# Patient Record
Sex: Female | Born: 1935 | Race: White | Hispanic: No | Smoking: Never smoker
Health system: Southern US, Community
[De-identification: ages and names within clinical notes are randomized; demographics above are authoritative.]

## PROBLEM LIST (undated history)

## (undated) DIAGNOSIS — I10 Essential (primary) hypertension: Secondary | ICD-10-CM

## (undated) DIAGNOSIS — K219 Gastro-esophageal reflux disease without esophagitis: Secondary | ICD-10-CM

## (undated) DIAGNOSIS — E039 Hypothyroidism, unspecified: Secondary | ICD-10-CM

## (undated) DIAGNOSIS — J449 Chronic obstructive pulmonary disease, unspecified: Secondary | ICD-10-CM

## (undated) HISTORY — PX: OTHER SURGICAL HISTORY: SHX169

## (undated) HISTORY — PX: CHOLECYSTECTOMY: SHX55

---

## 2002-04-07 ENCOUNTER — Encounter: Payer: Self-pay | Admitting: Specialist

## 2002-04-07 ENCOUNTER — Encounter: Payer: Self-pay | Admitting: Emergency Medicine

## 2002-04-07 ENCOUNTER — Inpatient Hospital Stay (HOSPITAL_COMMUNITY): Admission: EM | Admit: 2002-04-07 | Discharge: 2002-04-08 | Payer: Self-pay | Admitting: Unknown Physician Specialty

## 2002-04-07 ENCOUNTER — Encounter: Payer: Self-pay | Admitting: Orthopedic Surgery

## 2002-07-08 ENCOUNTER — Ambulatory Visit (HOSPITAL_COMMUNITY): Admission: RE | Admit: 2002-07-08 | Discharge: 2002-07-08 | Payer: Self-pay | Admitting: Specialist

## 2002-12-03 ENCOUNTER — Encounter: Payer: Self-pay | Admitting: Orthopedic Surgery

## 2002-12-05 ENCOUNTER — Inpatient Hospital Stay (HOSPITAL_COMMUNITY): Admission: RE | Admit: 2002-12-05 | Discharge: 2002-12-07 | Payer: Self-pay | Admitting: Orthopedic Surgery

## 2002-12-06 ENCOUNTER — Encounter (INDEPENDENT_AMBULATORY_CARE_PROVIDER_SITE_OTHER): Payer: Self-pay | Admitting: *Deleted

## 2003-06-28 ENCOUNTER — Emergency Department (HOSPITAL_COMMUNITY): Admission: EM | Admit: 2003-06-28 | Discharge: 2003-06-28 | Payer: Self-pay | Admitting: Emergency Medicine

## 2003-07-06 ENCOUNTER — Emergency Department (HOSPITAL_COMMUNITY): Admission: EM | Admit: 2003-07-06 | Discharge: 2003-07-06 | Payer: Self-pay

## 2003-07-07 ENCOUNTER — Inpatient Hospital Stay (HOSPITAL_COMMUNITY): Admission: EM | Admit: 2003-07-07 | Discharge: 2003-07-11 | Payer: Self-pay | Admitting: Emergency Medicine

## 2003-07-17 ENCOUNTER — Encounter: Admission: RE | Admit: 2003-07-17 | Discharge: 2003-07-17 | Payer: Self-pay | Admitting: Internal Medicine

## 2003-07-23 ENCOUNTER — Encounter: Admission: RE | Admit: 2003-07-23 | Discharge: 2003-07-23 | Payer: Self-pay | Admitting: Internal Medicine

## 2004-09-06 ENCOUNTER — Emergency Department (HOSPITAL_COMMUNITY): Admission: EM | Admit: 2004-09-06 | Discharge: 2004-09-06 | Payer: Self-pay | Admitting: *Deleted

## 2005-05-03 ENCOUNTER — Emergency Department (HOSPITAL_COMMUNITY): Admission: EM | Admit: 2005-05-03 | Discharge: 2005-05-03 | Payer: Self-pay | Admitting: Emergency Medicine

## 2007-11-07 ENCOUNTER — Emergency Department (HOSPITAL_COMMUNITY): Admission: EM | Admit: 2007-11-07 | Discharge: 2007-11-07 | Payer: Self-pay | Admitting: Emergency Medicine

## 2007-12-27 ENCOUNTER — Encounter: Admission: RE | Admit: 2007-12-27 | Discharge: 2007-12-27 | Payer: Self-pay | Admitting: *Deleted

## 2009-01-21 ENCOUNTER — Emergency Department (HOSPITAL_COMMUNITY): Admission: EM | Admit: 2009-01-21 | Discharge: 2009-01-21 | Payer: Self-pay | Admitting: Emergency Medicine

## 2010-10-24 LAB — CBC
HCT: 43.7 % (ref 36.0–46.0)
Hemoglobin: 14.9 g/dL (ref 12.0–15.0)
MCHC: 34.2 g/dL (ref 30.0–36.0)
MCV: 89.7 fL (ref 78.0–100.0)
Platelets: 236 10*3/uL (ref 150–400)
RBC: 4.88 MIL/uL (ref 3.87–5.11)
RDW: 13.8 % (ref 11.5–15.5)
WBC: 6.7 10*3/uL (ref 4.0–10.5)

## 2010-10-24 LAB — DIFFERENTIAL
Basophils Absolute: 0.1 10*3/uL (ref 0.0–0.1)
Basophils Relative: 1 % (ref 0–1)
Eosinophils Absolute: 0.2 10*3/uL (ref 0.0–0.7)
Eosinophils Relative: 2 % (ref 0–5)
Lymphocytes Relative: 25 % (ref 12–46)
Lymphs Abs: 1.7 10*3/uL (ref 0.7–4.0)
Monocytes Absolute: 0.7 10*3/uL (ref 0.1–1.0)
Monocytes Relative: 11 % (ref 3–12)
Neutro Abs: 4.1 10*3/uL (ref 1.7–7.7)
Neutrophils Relative %: 62 % (ref 43–77)

## 2010-10-24 LAB — BASIC METABOLIC PANEL
BUN: 9 mg/dL (ref 6–23)
CO2: 23 mEq/L (ref 19–32)
Calcium: 9.6 mg/dL (ref 8.4–10.5)
Chloride: 108 mEq/L (ref 96–112)
Creatinine, Ser: 0.83 mg/dL (ref 0.4–1.2)
GFR calc Af Amer: >60 mL/min (ref >60)
GFR calc non Af Amer: >60 mL/min (ref >60)
Glucose, Bld: 105 mg/dL — ABNORMAL HIGH (ref 70–99)
Potassium: 4.5 mEq/L (ref 3.5–5.1)
Sodium: 140 mEq/L (ref 135–145)

## 2010-10-24 LAB — POCT CARDIAC MARKERS
CKMB, poc: 1.1 ng/mL (ref 1.0–8.0)
Myoglobin, poc: 101 ng/mL (ref 12–200)
Troponin i, poc: <0.05 ng/mL (ref 0.00–0.09)

## 2010-10-24 LAB — BRAIN NATRIURETIC PEPTIDE: Pro B Natriuretic peptide (BNP): <30 pg/mL (ref 0.0–100.0)

## 2010-12-03 NOTE — Discharge Summary (Signed)
NAME:  Dana Pham, Dana Pham NO.:  [id number]   MEDICAL RECORD NO.:  [id number]                   PATIENT TYPE:  INP   LOCATION:  5727                                 FACILITY:  [facility]   PHYSICIAN:  Manning Charity, MD                  DATE OF BIRTH:  [birth year]   DATE OF ADMISSION:  07/07/2003  DATE OF DISCHARGE:  07/11/2003                                 DISCHARGE SUMMARY   DISCHARGE DIAGNOSES:  1. Pneumonia.  2. Suspected chronic obstructive pulmonary disease.  3. Euthyroid syndrome  4. Tobacco use.   DISCHARGE MEDICATIONS:  1. Serevent 1 puff q.12h.  2. Albuterol inhaler 2 puffs q.4h. p.r.n. shortness of breath.  3. Atrovent inhaler 3 puffs q.6h.  4. Prednisone taper consisting of 60 mg a day for 3 day, 50 mg a day for 3     days, 40 mg a day for 3 days, 30 mg a day for 3 day, 20 mg a day for 3     days, 10 mg a day for 3 days, and then stop.  5. Tequin 400 mg a day for 7 days after discharge.   PROCEDURES:  CT of the abdomen and chest at time of admission to rule out  aortic dissection and coarctation for subclavian steal syndrome.  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   CONSULTATIONS:  None.   HISTORY OF PRESENT ILLNESS:  The patient is a 77 year old white female with  no significant past medical history, on no medications currently and not  being seen by doctor, who presented to emergency department with nine days  of shortness of breath and cough productive of clear sputum.  The patient  had been to the emergency room about two weeks prior to admission with the  same symptoms and was discharged on prednisone, albuterol inhaler, and  antibiotics.  She then returned to the emergency room the Saturday prior to  admission with similar symptoms and was again discharged on the same  medications.  The  patient reported back to the emergency room on the day of  admission complaining of continuing symptoms.  The patient also reported  subjective fevers and chills and said she was feeling worse than she was two  weeks ago.  The patient reports that she never got her antibiotics  prescription filled and had not been seen by private doctor as instructed.  The patient denies any nausea, vomiting, chest pain.   PHYSICAL EXAMINATION:  VITAL SIGNS:  T-max 101.9, currently 98.7, pulse 79,  blood pressure in left arm 71/50, right arm 151/73, respiratory rate 28, O2  saturation 20% on room air.  GENERAL:  The patient was sitting up in bed with increased work of  breathing.  HEENT:  Eyes equal and reactive to light, nonicteric.  Throat without  erythema.  Oral mucosa moist.  NECK:  Supple without palpable thyroid masses.  LUNGS:  Respirations showed coarse breath sounds throughout, occasional  wheezing.  CARDIOVASCULAR:  Regular rate and rhythm with no murmurs, rubs, or gallops.  ABDOMEN:  Soft, nontender, obese.  Positive bowel sounds in all four  quadrants.  EXTREMITIES:  Without edema, 2+ pulses in extremities, 2+ pulses in right  upper extremity, and no palpable pulses in the left upper extremity.  RECTAL:  Guaiac negative.  SKIN:  Warm and dry.  LYMPH:  Without lymphadenopathy.  NEUROLOGIC:  Alert and oriented x 4.  Cranial nerves II-XII grossly intact  with symmetric strength and 2+ deep tendon reflexes.   LABORATORY DATA:  BMET: Sodium 136, potassium 4.4, chloride 102, CO2 25, BUN  11, creatinine 0.9, glucose 104.  CBC:  White blood cell count 15.9,  absolute neutrophil count 13.8, hemoglobin 13.9, platelet count 321,000.   Chest x-ray:  Bilateral patchy infiltrates which were new since x-rayed on  December 11.   Point-of-care cardiac enzymes in the emergency room were negative for any  signs of recent ischemia.   HOSPITAL COURSE:  #1.  PNEUMONIA:  Respiratory cultures, blood  cultures, and urine Legionella  antigen were all ordered.  These were all negative.  The patient was started  on broad-spectrum antibiotics, originally IV Rocephin and Zithromax.  This  was changed to p.o. Avelox as the patient improved.  The patient was also  started on IV Solu-Medrol initially for increased wheezing and suspected  COPD.  This was changed to p.o. prednisone as the patient improved.  The  patient was treated symptomatically and improved during hospitalization with  markedly decreased dyspnea.  Respiratory exam improved during  hospitalization as well with crackles in lungs bilaterally at the time of  discharge but no wheezes, greatly improved air movement.  The patient is to  return to the clinic on December 30 for recheck of her oxygen saturations  and repeat respiratory exam.   #2.  BLOOD PRESSURE GRADIENT BETWEEN LEFT ARM AND RIGHT ARM:  Chest CT was  obtained on admission with results as noted above.  Arterial Dopplers were  obtained of the bilateral upper extremities.  These revealed a significant  stenosis of the subclavian artery.  The patient has had this problem for a  long time with good collateral circulation, left arm warm, with good hair  distribution.  No further workup was pursued during hospitalization.   #3.  EUTHYROID SYNDROME:  TSH was noted to be markedly decreased at 0.109 on  first draw and 0.049 on second draw.  However, free T4 was  within normal  range at 1.29.  Therefore, this was attributed to euthyroid syndrome.  The  patient is asymptomatic for any symptoms of thyroid disease; however, she  does hae a history of hypothyroidism in her family.  Needs to be rechecked  as an outpatient.   #4.  TOBACCO USE:  The patient was counseled extensively.  Smoking cessation  consult was obtained.  The patient decided to quit smoking.  Nicotine patch  was used while in the hospital.  The patient states that she plans to continue this as an outpatient.  She  was also counseled on the harm of  second-hand smoke.   DISPOSITION:  The patient was discharged to home with her son in good  condition.  She has followup appointment in the internal medicine outpatient  clinic  on December 30 at 9 o'clock.  At that time, she needs a recheck of  her oxygen saturation and repeat respiratory exam.   Issues at followup: The patient likely needs PFTs when acute illness has  resolved to get an idea of her baseline function.  Strongly suspect COPD.  The patient will also need a repeat TSH at some point after discharge.  The  patient will also need monitoring of her left upper extremity to ensure that  blood flow is not further compromised and she has no signs of ischemia.                                                Manning Charity, MD    KK/MEDQ  D:  07/11/2003  T:  07/13/2003  Job:  [id number]   cc:   Outpatient Clinic

## 2010-12-03 NOTE — Op Note (Signed)
NAME:  Dana Pham, Dana Pham                         ACCOUNT NO.:  [id number]   MEDICAL RECORD NO.:  [id number]                   PATIENT TYPE:  INP   LOCATION:  5017                                 FACILITY:  [facility]   PHYSICIAN:  Dionne Ano. Everlene Other, M.D.         DATE OF BIRTH:  03/09/1932   DATE OF PROCEDURE:  12/05/2002  DATE OF DISCHARGE:                                 OPERATIVE REPORT   PREOPERATIVE DIAGNOSES:  1. Painful malunion, right olecranon.  2. Traumatic arthritis, radial capitellar joint, right elbow with     incongruity about the joint.   POSTOPERATIVE DIAGNOSES:  1. Painful malunion, right olecranon.  2. Traumatic arthritis, radial capitellar joint, right elbow with     incongruity about the joint.   OPERATION PERFORMED:  1. Takedown nonunion, right olecranon with application of plate and screws     and iliac crest bone graft obtained from the right iliac crest.  Symphony     platelet aggregate was also added to the bone graft mixture for healing     purposes.  2. Manipulation under anesthesia, right elbow.  3. Radial head resection about the radial capitellar joint.  4. Stress radiography, right elbow.   SURGEON:  Dionne Ano. Amanda Pea, M.D.   ASSISTANT:  Karie Chimera, P.A.-C.   ANESTHESIA:  General.   COMPLICATIONS:  None.   TOURNIQUET TIME:  90 minutes.   DRAINS:  One in the right hip.   INDICATIONS FOR PROCEDURE:  The patient is a very pleasant 77 year old  female who presents with the above mentioned diagnosis.  I have counseled  her in regard to the risks and benefits of surgery including the risks of  infection, bleeding, anesthesia, damage to normal structures and failure of  surgery to accomplish its intended goals of relieving symptoms and restoring  function.  With this in mind, the patient desires to proceed.  All questions  have been encouraged and answered preoperatively.  The patient understands  the operative plan, risks of malunion,  nonunion, and other complications  such as infection and hardware failure.  I have implored her to quit  smoking, to perform physical exercise and to adequately maintain proper  nutritional status.  She understands this and has been trying her best.  She  is ready for surgery and understands all risks and benefits.   DESCRIPTION OF PROCEDURE:  The patient was seen by myself and anesthesia,  taken to the operative suite and underwent the smooth induction of general  anesthetic. She was then laid supine and then turned slightly and  appropriately positioned with a bean bag.  The bean bag was inflated.  I  then isolated the right upper extremity and performed range of motion.  She  had a blocked pronation and supination and poor extension and flexion.  The  patient had the hip and right arm isolated, prepped and draped in the usual  sterile fashion after all body  parts were padded nicely.  Once sterile prep  was secured, the patient had 20mL of Marcaine 0.25% with epinephrine placed  in the hip prior to obtaining iliac crest bone graft.  The patient then  underwent tourniquet insufflation with sterile tourniquet to 250 mmHg and  incision was made along previously made outline marks prior to placement of  Ioban against the skin.  Dissection was carried down full thickness, through  the skin down to the fascia.  I then elevated the fascia off of the  olecranon and proximal ulna.  Once this was done, the nonunion site was  identified.  Following this, I preplaced the plate about the ulna taking  care to note the pathway of the ulnar nerve and stay out of harm's way.  I  placed one screw hole and then placed pin marks in the ulna for rotation  purposes and following this, then took down the nonunion site with a  combination knife, curet, drill and rongeur.  I was able to open both the  proximal and distal medullary canals down to cancellous bone and completely  resect the nonunion to my  satisfaction.  Following this, I then  irrigated  the  wound copiously.  Once this was done, we then turned attention towards  the hip.  An incision was made two inches in length and carried down to the  fascia sharply.  Retractor was placed, the fascia was incised deeply and  elevated off of the iliac crest.  I then placed a Baby Bennett retractor and  made a cortical window.  Following this, I opened a cortical window and  obtained a large amount of cancellous bone graft.  This was done to my  satisfaction.  Following this, I irrigated the wound and placed Gelfoam with  Thrombin in the wound and then packed it with moistened gauze.  The patient  was then turned back towards the olecranon.  The nonunion site was packed  tightly with iliac crest bone graft, Symphony platelet aggregate, spun down  with centrifuge was then added to this mixture.  I then applied the plate  and screws from the Acumed plate fixation system.  This was done in standard  AO technique.  It was done to my satisfaction without difficulty.  I was  pleased with the screws and placed this under compression.  The bone was  tightly packed in both canals proximally and distally and at the nonunion  site.  I was very pleased with this and the findings.  Following this,  I  then took x-rays AP and lateral and multiple obliques to ensure proper  position of the screws.  I made adjustments as necessary and was very  pleased with the fixation.  Once this was done, the patient had additional  platelet aggregate placed in the nonunion site.  I then closed this wound  with 0 Vicryl over the periosteal tissues.  This closed exceptionally well.  Following this, an interval was created between the fascia and subcu and the  anconeus ECU interval was identified and opened.  I then identified the  radial capitellar joint which was quite crepitant and arthritic.  I placed  Baby Bennett retractors around the radial neck and then resected  the radial head.  I then debrided the proximal radial ulnar joint and the elbow joint  itself.  This was done to my satisfaction without difficulty.  Following  removal of the radial head, it was then sent for specimen.  This  was a very  arthritic and incongruous head.  The capitellum was also arthritic.  Following this, the block to pronation and supination was alleviated.  I  stress tested the forearm and noted there was no subsidence of the radial  shaft indicative of an Essex -Lopresti injury.  Following this, I irrigated  the wound, smoothed the end and placed bone wax against the end of the  radial shaft, further debridement was accomplished and following this, the  interval was closed after I placed a small amount of platelet aggregate in  the tissues.  This was closed with Vicryl.  I took care to be aware of the  pathway of the posterior interosseous nerve and protect it at all times  during the radial head resection.  This was not dissected.  Following  closure of this wound, I then turned attention back towards the hip.  This  was irrigated copiously and then closed after hemostasis was secured with 0  Vicryl followed by 2-0 Vicryl and a subcuticular Prolene.  An eighth inch  Hemovac drain (medium Hemovac drain) was placed prior to the deep closure.  This closed nicely and had good hemostasis.  Platelet poor aggregate was  placed in this area as well for bleeding control purposes.  I then placed  the Thrombin platelet poor mixture about the elbow and closed this with 3-0  Vicryl followed by a staple down at the skin edge.  The patient did undergo  manipulation of the right elbow under anesthesia during the procedure and I  noted that the patient had full pronation and supination and extension to 30  degrees and nearly full flexion.  This was done by manipulating the elbow to  the point of maximal resistance and then holding this and giving it a bit  extra pressure.  This was done  to my satisfaction without difficulty and I  was pleased with the gains made.  Once this was all complete, I then dressed  the wounds sterilely and placed a posterior fascial splint about the elbow.  The patient tolerated the procedure well without difficulty.  Had excellent  refill, good pulse, no sign of compartment syndrome and was stable.  The  patient was then extubated and transferred to recovery  room and will be monitored.  Will plan for intravenous antibiotics,  elevation, ice, occupational therapy consult for range of motion and  movement control.  Pain management appropriate to her needs and other  postoperative measures.  I discussed do's and do not's with her  and all  questions have been encouraged and answered.                                                Dionne Ano. Everlene Other, M.D.    Nash Mantis  D:  12/05/2002  T:  12/06/2002  Job:  [id number]

## 2010-12-03 NOTE — H&P (Signed)
   NAME:  Dana Pham, Dana Pham                         ACCOUNT NO.:  [id number]   MEDICAL RECORD NO.:  [id number]                   PATIENT TYPE:  INP   LOCATION:  0462                                 FACILITY:  [facility]   PHYSICIAN:  Philips J. Montez Morita, M.D.             DATE OF BIRTH:  [birth year]   DATE OF ADMISSION:  04/07/2002  DATE OF DISCHARGE:                                HISTORY & PHYSICAL   CHIEF COMPLAINT:  I hurt my elbow.   HISTORY OF PRESENT ILLNESS:  This is a 77 year old lady who fell. X-rays  have showed a comminuted displaced fracture of the right olecranon.   PAST MEDICAL HISTORY:  I did a total knee on her about four or five years  ago, her general health otherwise has been excellent.   MEDICINES:  None.   ALLERGIES:  None.   REVIEW OF SYMPTOMS:  HEENT:  No recent problems. CHEST:  No cough,  hemoptysis or TB. No angina, chest pain, shortness of breath, hypertension.  ABDOMEN:  No diarrhea, constipation, bowel changes. GU:  No stones,  infection, pyuria. NEUROLOGIC:  No fits, paralyses or convulsions.   PHYSICAL EXAMINATION:  EXTREMITIES:  The right elbow swollen and painful to  move. Neurovascular looks okay in that extremity. Right total knee scar.  HEART/LUNGS:  Okay. ABDOMEN:  Soft.   IMPRESSION:  Displaced comminuted right olecranon fracture.   PLAN:  Admit for open reduction and internal fixation.                                               Philips J. Montez Morita, M.D.    PJC/MEDQ  D:  04/07/2002  T:  04/08/2002  Job:  [id number]

## 2010-12-03 NOTE — Op Note (Signed)
   NAME:  Dana Pham, Dana Pham                         ACCOUNT NO.:  [id number]   MEDICAL RECORD NO.:  [id number]                   PATIENT TYPE:  INP   LOCATION:  0462                                 FACILITY:  [facility]   PHYSICIAN:  Philips J. Montez Morita, M.D.             DATE OF BIRTH:  [birth year]   DATE OF PROCEDURE:  04/07/2002  DATE OF DISCHARGE:                                 OPERATIVE REPORT   PREOPERATIVE DIAGNOSES:  Comminuted displaced closed right olecranon  fracture.   POSTOPERATIVE DIAGNOSES:  Comminuted displaced closed right olecranon  fracture.   PROCEDURE:  Open reduction and internal fixation.   DESCRIPTION OF PROCEDURE:  After suitable general anesthesia, the arm was  prepped and draped routinely with the elbow across the chest. An upper arm  tourniquet has been inflated to 250 mmHg. A linear incision is made over the  olecranon extending posteriorly and the fracture site is identified and  reduced. A large Steinmann pin is driven across the fracture site and a  single x-ray is made to confirm its position which looks good. A drill hole  was then made through the distal ulna and an 18 gauge wire is passed. It is  placed in a figure-of-eight fashion around the Steinmann pin and a double  tensioning maneuver is applied so that both sides can be tensioned and the  wires are tensioned and tightened closing down the fracture site and giving  excellent stability. The Steinmann pin is then held with a pair of pliers  and bent to 90 degrees, it is cut off and then hammered into the soft tissue  of the triceps tendon. The tourniquet is let down at this point, it is about  30 minutes. Hemostasis is secured. The closure with 2-0 coated Vicryl and  then a running 3-0 Monocryl in the subcu with Steri-Strips, about 15 cc of  0.5% Marcaine about the area. A nice compression dressing, a posterior  splint, she goes to recovery in good condition.         Philips J. Montez Morita, M.D.    PJC/MEDQ  D:  04/07/2002  T:  04/08/2002  Job:  [id number]

## 2010-12-03 NOTE — Op Note (Signed)
   NAME:  [name], Dana Pham                         ACCOUNT NO.:  [id number]   MEDICAL RECORD NO.:  [id number]                   PATIENT TYPE:  AMB   LOCATION:  DAY                                  FACILITY:  [facility]   PHYSICIAN:  Ronnell Guadalajara, M.D.                DATE OF BIRTH:  07/17/1932   DATE OF PROCEDURE:  07/08/2002  DATE OF DISCHARGE:                                 OPERATIVE REPORT   PREOPERATIVE DIAGNOSES:  Infection around the Rush pin right humerus  following olecranon fracture.   POSTOPERATIVE DIAGNOSES:  Infection around the Rush pin right humerus  following olecranon fracture.   PROCEDURE:  Removal of Rush pin and compression figure eight wire.   DESCRIPTION OF PROCEDURE:  After suitable general anesthesia, the right  elbow was prepped and draped routinely and after elevation of the arm and  upper arm, the tourniquet was inflated to 250 mmHg. An incision was made  through the old incision. The pin is extracted. The wire loops are  identified and the wires cut on either side and pulled off in two pieces.  Closure with nylon, tourniquet let down, the wound was pinking up well.  Compression dressing, she goes back into a posterior splint and then she is  to go back into a bone stimulator and be back on Keflex. She did receive a  Gram of IV Kefzol. There is loose closure allowing easy drainage. She goes  to recovery in good condition.                                               Ronnell Guadalajara, M.D.    PC/MEDQ  D:  07/08/2002  T:  07/08/2002  Job:  [id number]

## 2011-04-12 LAB — POCT I-STAT, CHEM 8
BUN: 18
Calcium, Ion: 1.14
Chloride: 106
Creatinine, Ser: 1
Glucose, Bld: 82
HCT: 45
Hemoglobin: 15.3 — ABNORMAL HIGH
Potassium: 4.1
Sodium: 139
TCO2: 25

## 2018-01-14 ENCOUNTER — Inpatient Hospital Stay (HOSPITAL_COMMUNITY)
Admission: EM | Admit: 2018-01-14 | Discharge: 2018-01-22 | DRG: 190 | Disposition: A | Discharge status: Skilled Nursing Facility | Payer: Medicare PPO | Attending: Internal Medicine | Admitting: Internal Medicine

## 2018-01-14 ENCOUNTER — Inpatient Hospital Stay (HOSPITAL_COMMUNITY): Payer: Medicare PPO

## 2018-01-14 ENCOUNTER — Encounter (HOSPITAL_COMMUNITY): Payer: Self-pay | Admitting: Emergency Medicine

## 2018-01-14 ENCOUNTER — Other Ambulatory Visit: Payer: Self-pay

## 2018-01-14 ENCOUNTER — Emergency Department (HOSPITAL_COMMUNITY): Payer: Medicare PPO

## 2018-01-14 ENCOUNTER — Inpatient Hospital Stay (HOSPITAL_COMMUNITY): Payer: Self-pay

## 2018-01-14 DIAGNOSIS — Z66 Do not resuscitate: Secondary | ICD-10-CM | POA: Diagnosis not present

## 2018-01-14 DIAGNOSIS — Z91040 Latex allergy status: Secondary | ICD-10-CM

## 2018-01-14 DIAGNOSIS — I5032 Chronic diastolic (congestive) heart failure: Secondary | ICD-10-CM | POA: Diagnosis not present

## 2018-01-14 DIAGNOSIS — Z7982 Long term (current) use of aspirin: Secondary | ICD-10-CM

## 2018-01-14 DIAGNOSIS — Z96651 Presence of right artificial knee joint: Secondary | ICD-10-CM | POA: Diagnosis present

## 2018-01-14 DIAGNOSIS — W19XXXA Unspecified fall, initial encounter: Secondary | ICD-10-CM | POA: Diagnosis present

## 2018-01-14 DIAGNOSIS — R310 Gross hematuria: Secondary | ICD-10-CM | POA: Diagnosis present

## 2018-01-14 DIAGNOSIS — E871 Hypo-osmolality and hyponatremia: Secondary | ICD-10-CM | POA: Diagnosis present

## 2018-01-14 DIAGNOSIS — K921 Melena: Secondary | ICD-10-CM | POA: Diagnosis not present

## 2018-01-14 DIAGNOSIS — Z79899 Other long term (current) drug therapy: Secondary | ICD-10-CM | POA: Diagnosis not present

## 2018-01-14 DIAGNOSIS — I10 Essential (primary) hypertension: Secondary | ICD-10-CM | POA: Insufficient documentation

## 2018-01-14 DIAGNOSIS — J9611 Chronic respiratory failure with hypoxia: Secondary | ICD-10-CM | POA: Diagnosis not present

## 2018-01-14 DIAGNOSIS — R195 Other fecal abnormalities: Secondary | ICD-10-CM | POA: Diagnosis not present

## 2018-01-14 DIAGNOSIS — I340 Nonrheumatic mitral (valve) insufficiency: Secondary | ICD-10-CM

## 2018-01-14 DIAGNOSIS — I110 Hypertensive heart disease with heart failure: Secondary | ICD-10-CM | POA: Diagnosis present

## 2018-01-14 DIAGNOSIS — D509 Iron deficiency anemia, unspecified: Secondary | ICD-10-CM | POA: Diagnosis not present

## 2018-01-14 DIAGNOSIS — R0682 Tachypnea, not elsewhere classified: Secondary | ICD-10-CM

## 2018-01-14 DIAGNOSIS — J9621 Acute and chronic respiratory failure with hypoxia: Secondary | ICD-10-CM | POA: Diagnosis not present

## 2018-01-14 DIAGNOSIS — K219 Gastro-esophageal reflux disease without esophagitis: Secondary | ICD-10-CM | POA: Diagnosis present

## 2018-01-14 DIAGNOSIS — R0600 Dyspnea, unspecified: Secondary | ICD-10-CM

## 2018-01-14 DIAGNOSIS — I700 Atherosclerosis of aorta: Secondary | ICD-10-CM | POA: Diagnosis present

## 2018-01-14 DIAGNOSIS — Z7951 Long term (current) use of inhaled steroids: Secondary | ICD-10-CM | POA: Diagnosis not present

## 2018-01-14 DIAGNOSIS — K922 Gastrointestinal hemorrhage, unspecified: Secondary | ICD-10-CM | POA: Diagnosis not present

## 2018-01-14 DIAGNOSIS — E860 Dehydration: Secondary | ICD-10-CM | POA: Diagnosis present

## 2018-01-14 DIAGNOSIS — J441 Chronic obstructive pulmonary disease with (acute) exacerbation: Secondary | ICD-10-CM | POA: Diagnosis present

## 2018-01-14 DIAGNOSIS — M79605 Pain in left leg: Secondary | ICD-10-CM

## 2018-01-14 DIAGNOSIS — M7989 Other specified soft tissue disorders: Secondary | ICD-10-CM | POA: Diagnosis present

## 2018-01-14 DIAGNOSIS — D500 Iron deficiency anemia secondary to blood loss (chronic): Secondary | ICD-10-CM | POA: Diagnosis present

## 2018-01-14 DIAGNOSIS — Z9981 Dependence on supplemental oxygen: Secondary | ICD-10-CM | POA: Diagnosis not present

## 2018-01-14 DIAGNOSIS — E039 Hypothyroidism, unspecified: Secondary | ICD-10-CM | POA: Diagnosis present

## 2018-01-14 DIAGNOSIS — I480 Paroxysmal atrial fibrillation: Secondary | ICD-10-CM | POA: Diagnosis not present

## 2018-01-14 DIAGNOSIS — Z96643 Presence of artificial hip joint, bilateral: Secondary | ICD-10-CM | POA: Diagnosis present

## 2018-01-14 DIAGNOSIS — M79604 Pain in right leg: Secondary | ICD-10-CM

## 2018-01-14 DIAGNOSIS — N179 Acute kidney failure, unspecified: Secondary | ICD-10-CM | POA: Diagnosis present

## 2018-01-14 DIAGNOSIS — I4891 Unspecified atrial fibrillation: Secondary | ICD-10-CM | POA: Diagnosis not present

## 2018-01-14 DIAGNOSIS — J811 Chronic pulmonary edema: Secondary | ICD-10-CM

## 2018-01-14 DIAGNOSIS — N172 Acute kidney failure with medullary necrosis: Secondary | ICD-10-CM

## 2018-01-14 DIAGNOSIS — J431 Panlobular emphysema: Secondary | ICD-10-CM | POA: Diagnosis not present

## 2018-01-14 HISTORY — DX: Hypothyroidism, unspecified: E03.9

## 2018-01-14 HISTORY — DX: Chronic obstructive pulmonary disease, unspecified: J44.9

## 2018-01-14 HISTORY — DX: Essential (primary) hypertension: I10

## 2018-01-14 HISTORY — DX: Gastro-esophageal reflux disease without esophagitis: K21.9

## 2018-01-14 LAB — URINALYSIS, ROUTINE W REFLEX MICROSCOPIC
BILIRUBIN URINE: NEGATIVE
Glucose, UA: NEGATIVE mg/dL
Ketones, ur: NEGATIVE mg/dL
Nitrite: NEGATIVE
PH: 6 (ref 5.0–8.0)
Protein, ur: 30 mg/dL — AB
SPECIFIC GRAVITY, URINE: 1.005 (ref 1.005–1.030)

## 2018-01-14 LAB — HEMOGLOBIN A1C
Hgb A1c MFr Bld: 5.9 % — ABNORMAL HIGH (ref 4.8–5.6)
Mean Plasma Glucose: 122.63 mg/dL

## 2018-01-14 LAB — DIFFERENTIAL
Abs Immature Granulocytes: 0.1 10*3/uL (ref 0.0–0.1)
Basophils Absolute: 0 10*3/uL (ref 0.0–0.1)
Basophils Relative: 0 %
EOS ABS: 0.1 10*3/uL (ref 0.0–0.7)
Eosinophils Relative: 0 %
Immature Granulocytes: 1 %
LYMPHS ABS: 1.2 10*3/uL (ref 0.7–4.0)
Lymphocytes Relative: 9 %
MONOS PCT: 6 %
Monocytes Absolute: 0.9 10*3/uL (ref 0.1–1.0)
Neutro Abs: 12 10*3/uL — ABNORMAL HIGH (ref 1.7–7.7)
Neutrophils Relative %: 84 %

## 2018-01-14 LAB — CBC
HEMATOCRIT: 30.1 % — AB (ref 36.0–46.0)
HEMATOCRIT: 31.8 % — AB (ref 36.0–46.0)
HEMATOCRIT: 32.1 % — AB (ref 36.0–46.0)
Hemoglobin: 9.6 g/dL — ABNORMAL LOW (ref 12.0–15.0)
Hemoglobin: 10.1 g/dL — ABNORMAL LOW (ref 12.0–15.0)
Hemoglobin: 10.4 g/dL — ABNORMAL LOW (ref 12.0–15.0)
MCH: 24.7 pg — ABNORMAL LOW (ref 26.0–34.0)
MCH: 24.3 pg — AB (ref 26.0–34.0)
MCH: 24.4 pg — AB (ref 26.0–34.0)
MCHC: 31.8 g/dL (ref 30.0–36.0)
MCHC: 31.9 g/dL (ref 30.0–36.0)
MCHC: 32.4 g/dL (ref 30.0–36.0)
MCV: 77.6 fL — ABNORMAL LOW (ref 78.0–100.0)
MCV: 75.4 fL — AB (ref 78.0–100.0)
MCV: 76.4 fL — AB (ref 78.0–100.0)
PLATELETS: 311 10*3/uL (ref 150–400)
PLATELETS: 318 10*3/uL (ref 150–400)
Platelets: 361 10*3/uL (ref 150–400)
RBC: 3.88 MIL/uL (ref 3.87–5.11)
RBC: 4.16 MIL/uL (ref 3.87–5.11)
RBC: 4.26 MIL/uL (ref 3.87–5.11)
RDW: 15.9 % — ABNORMAL HIGH (ref 11.5–15.5)
RDW: 15.9 % — ABNORMAL HIGH (ref 11.5–15.5)
RDW: 16.1 % — AB (ref 11.5–15.5)
WBC: 14.6 10*3/uL — ABNORMAL HIGH (ref 4.0–10.5)
WBC: 12 10*3/uL — AB (ref 4.0–10.5)
WBC: 11.4 10*3/uL — ABNORMAL HIGH (ref 4.0–10.5)

## 2018-01-14 LAB — I-STAT CHEM 8, ED
BUN: 56 mg/dL — ABNORMAL HIGH (ref 8–23)
CREATININE: 5 mg/dL — AB (ref 0.44–1.00)
Calcium, Ion: 1.06 mmol/L — ABNORMAL LOW (ref 1.15–1.40)
Chloride: 97 mmol/L — ABNORMAL LOW (ref 98–111)
Glucose, Bld: 104 mg/dL — ABNORMAL HIGH (ref 70–99)
HEMATOCRIT: 31 % — AB (ref 36.0–46.0)
HEMOGLOBIN: 10.5 g/dL — AB (ref 12.0–15.0)
Potassium: 3.6 mmol/L (ref 3.5–5.1)
Sodium: 129 mmol/L — ABNORMAL LOW (ref 135–145)
TCO2: 19 mmol/L — AB (ref 22–32)

## 2018-01-14 LAB — IRON AND TIBC
IRON: 14 ug/dL — AB (ref 28–170)
Saturation Ratios: 5 % — ABNORMAL LOW (ref 10.4–31.8)
TIBC: 269 ug/dL (ref 250–450)
UIBC: 255 ug/dL

## 2018-01-14 LAB — APTT: aPTT: 26 seconds (ref 24–36)

## 2018-01-14 LAB — C-REACTIVE PROTEIN: CRP: 18.8 mg/dL — ABNORMAL HIGH (ref <1.0)

## 2018-01-14 LAB — SODIUM, URINE, RANDOM: SODIUM UR: 47 mmol/L

## 2018-01-14 LAB — RESPIRATORY PANEL BY PCR
ADENOVIRUS-RVPPCR: NOT DETECTED
Bordetella pertussis: NOT DETECTED
CHLAMYDOPHILA PNEUMONIAE-RVPPCR: NOT DETECTED
CORONAVIRUS HKU1-RVPPCR: NOT DETECTED
CORONAVIRUS NL63-RVPPCR: NOT DETECTED
CORONAVIRUS OC43-RVPPCR: NOT DETECTED
Coronavirus 229E: NOT DETECTED
Influenza A: NOT DETECTED
Influenza B: NOT DETECTED
MYCOPLASMA PNEUMONIAE-RVPPCR: NOT DETECTED
Metapneumovirus: NOT DETECTED
PARAINFLUENZA VIRUS 1-RVPPCR: NOT DETECTED
PARAINFLUENZA VIRUS 3-RVPPCR: NOT DETECTED
Parainfluenza Virus 2: NOT DETECTED
Parainfluenza Virus 4: NOT DETECTED
Respiratory Syncytial Virus: NOT DETECTED
Rhinovirus / Enterovirus: NOT DETECTED

## 2018-01-14 LAB — BASIC METABOLIC PANEL
Anion gap: 14 (ref 5–15)
BUN: 56 mg/dL — AB (ref 8–23)
CO2: 18 mmol/L — ABNORMAL LOW (ref 22–32)
CREATININE: 4.72 mg/dL — AB (ref 0.44–1.00)
Calcium: 8 mg/dL — ABNORMAL LOW (ref 8.9–10.3)
Chloride: 97 mmol/L — ABNORMAL LOW (ref 98–111)
GFR calc Af Amer: 9 mL/min — ABNORMAL LOW (ref >60)
GFR, EST NON AFRICAN AMERICAN: 8 mL/min — AB (ref >60)
GLUCOSE: 106 mg/dL — AB (ref 70–99)
POTASSIUM: 3.6 mmol/L (ref 3.5–5.1)
Sodium: 129 mmol/L — ABNORMAL LOW (ref 135–145)

## 2018-01-14 LAB — RETICULOCYTES
RBC.: 4.16 MIL/uL (ref 3.87–5.11)
RETIC COUNT ABSOLUTE: 66.6 10*3/uL (ref 19.0–186.0)
Retic Ct Pct: 1.6 % (ref 0.4–3.1)

## 2018-01-14 LAB — LACTIC ACID, PLASMA
LACTIC ACID, VENOUS: 1 mmol/L (ref 0.5–1.9)
LACTIC ACID, VENOUS: 1.4 mmol/L (ref 0.5–1.9)

## 2018-01-14 LAB — HEPATIC FUNCTION PANEL
ALK PHOS: 78 U/L (ref 38–126)
ALT: 32 U/L (ref 0–44)
AST: 52 U/L — ABNORMAL HIGH (ref 15–41)
Albumin: 2.4 g/dL — ABNORMAL LOW (ref 3.5–5.0)
BILIRUBIN DIRECT: 0.1 mg/dL (ref 0.0–0.2)
BILIRUBIN INDIRECT: 0.5 mg/dL (ref 0.3–0.9)
BILIRUBIN TOTAL: 0.6 mg/dL (ref 0.3–1.2)
Total Protein: 5.8 g/dL — ABNORMAL LOW (ref 6.5–8.1)

## 2018-01-14 LAB — CK: Total CK: 1256 U/L — ABNORMAL HIGH (ref 38–234)

## 2018-01-14 LAB — POC OCCULT BLOOD, ED: Fecal Occult Bld: POSITIVE — AB

## 2018-01-14 LAB — PROTIME-INR
INR: 1.17
PROTHROMBIN TIME: 14.8 s (ref 11.4–15.2)

## 2018-01-14 LAB — ECHOCARDIOGRAM COMPLETE
HEIGHTINCHES: 64 in
WEIGHTICAEL: 2096 [oz_av]

## 2018-01-14 LAB — BRAIN NATRIURETIC PEPTIDE: B Natriuretic Peptide: 551.4 pg/mL — ABNORMAL HIGH (ref 0.0–100.0)

## 2018-01-14 LAB — TSH: TSH: 0.032 u[IU]/mL — AB (ref 0.350–4.500)

## 2018-01-14 LAB — I-STAT TROPONIN, ED: Troponin i, poc: 0.03 ng/mL (ref 0.00–0.08)

## 2018-01-14 LAB — FOLATE: FOLATE: 15.8 ng/mL (ref >5.9)

## 2018-01-14 LAB — SEDIMENTATION RATE: Sed Rate: 40 mm/hr — ABNORMAL HIGH (ref 0–22)

## 2018-01-14 LAB — VITAMIN B12: Vitamin B-12: 447 pg/mL (ref 180–914)

## 2018-01-14 LAB — HIV ANTIBODY (ROUTINE TESTING W REFLEX): HIV SCREEN 4TH GENERATION: NONREACTIVE

## 2018-01-14 LAB — STREP PNEUMONIAE URINARY ANTIGEN: Strep Pneumo Urinary Antigen: NEGATIVE

## 2018-01-14 LAB — FERRITIN: Ferritin: 192 ng/mL (ref 11–307)

## 2018-01-14 LAB — OSMOLALITY, URINE: Osmolality, Ur: 185 mOsm/kg — ABNORMAL LOW (ref 300–900)

## 2018-01-14 LAB — OSMOLALITY: Osmolality: 285 mOsm/kg (ref 275–295)

## 2018-01-14 LAB — PROCALCITONIN: PROCALCITONIN: 1.3 ng/mL

## 2018-01-14 MED ORDER — ONDANSETRON HCL 4 MG/2ML IJ SOLN: 4.0000 mg | Freq: Three times a day (TID) | INTRAMUSCULAR | Status: DC | PRN

## 2018-01-14 MED ORDER — SODIUM CHLORIDE 0.9 % IV SOLN
INTRAVENOUS | Status: DC
  Administered 2018-01-14 – 2018-01-15 (×2): via INTRAVENOUS

## 2018-01-14 MED ORDER — SODIUM CHLORIDE 0.9 % IV BOLUS
500.0000 mL | Freq: Once | INTRAVENOUS | Status: AC
  Administered 2018-01-14: 500 mL via INTRAVENOUS

## 2018-01-14 MED ORDER — PANTOPRAZOLE SODIUM 40 MG PO TBEC
40.0000 mg | DELAYED_RELEASE_TABLET | Freq: Every day | ORAL | Status: DC
  Administered 2018-01-15 – 2018-01-21 (×7): 40 mg via ORAL
  Filled 2018-01-14 (×7): qty 1

## 2018-01-14 MED ORDER — LEVALBUTEROL HCL 1.25 MG/0.5ML IN NEBU
1.2500 mg | INHALATION_SOLUTION | Freq: Three times a day (TID) | RESPIRATORY_TRACT | Status: DC
  Administered 2018-01-15 – 2018-01-18 (×11): 1.25 mg via RESPIRATORY_TRACT
  Filled 2018-01-14 (×13): qty 0.5

## 2018-01-14 MED ORDER — IPRATROPIUM-ALBUTEROL 0.5-2.5 (3) MG/3ML IN SOLN
3.0000 mL | Freq: Once | RESPIRATORY_TRACT | Status: AC
  Administered 2018-01-14: 3 mL via RESPIRATORY_TRACT
  Filled 2018-01-14: qty 3

## 2018-01-14 MED ORDER — HYDRALAZINE HCL 20 MG/ML IJ SOLN: 5.0000 mg | INTRAMUSCULAR | Status: DC | PRN

## 2018-01-14 MED ORDER — LEVOTHYROXINE SODIUM 25 MCG PO TABS
25.0000 ug | ORAL_TABLET | Freq: Every day | ORAL | Status: DC
  Administered 2018-01-14 – 2018-01-19 (×5): 25 ug via ORAL
  Filled 2018-01-14 (×6): qty 1

## 2018-01-14 MED ORDER — PANTOPRAZOLE SODIUM 40 MG PO TBEC: 40.0000 mg | DELAYED_RELEASE_TABLET | Freq: Every day | ORAL | Status: DC

## 2018-01-14 MED ORDER — MOMETASONE FURO-FORMOTEROL FUM 200-5 MCG/ACT IN AERO
2.0000 | INHALATION_SPRAY | Freq: Two times a day (BID) | RESPIRATORY_TRACT | Status: DC
  Administered 2018-01-14 – 2018-01-22 (×16): 2 via RESPIRATORY_TRACT
  Filled 2018-01-14: qty 8.8

## 2018-01-14 MED ORDER — METHYLPREDNISOLONE SODIUM SUCC 125 MG IJ SOLR
125.0000 mg | Freq: Once | INTRAMUSCULAR | Status: AC
  Administered 2018-01-14: 125 mg via INTRAVENOUS
  Filled 2018-01-14: qty 2

## 2018-01-14 MED ORDER — ACETAMINOPHEN 325 MG PO TABS: 650.0000 mg | ORAL_TABLET | Freq: Four times a day (QID) | ORAL | Status: DC | PRN

## 2018-01-14 MED ORDER — AMLODIPINE BESYLATE 10 MG PO TABS
10.0000 mg | ORAL_TABLET | Freq: Every day | ORAL | Status: DC
  Administered 2018-01-14 – 2018-01-18 (×5): 10 mg via ORAL
  Filled 2018-01-14 (×5): qty 1

## 2018-01-14 MED ORDER — SODIUM CHLORIDE 0.9 % IV SOLN: 1.0000 g | INTRAVENOUS | Status: DC

## 2018-01-14 MED ORDER — HEPARIN SODIUM (PORCINE) 5000 UNIT/ML IJ SOLN: 5000.0000 [IU] | Freq: Three times a day (TID) | INTRAMUSCULAR | Status: DC

## 2018-01-14 MED ORDER — ASPIRIN EC 81 MG PO TBEC: 81.0000 mg | DELAYED_RELEASE_TABLET | Freq: Every day | ORAL | Status: DC

## 2018-01-14 MED ORDER — SODIUM CHLORIDE 0.9 % IV SOLN
1.0000 g | Freq: Once | INTRAVENOUS | Status: AC
  Administered 2018-01-14: 1 g via INTRAVENOUS
  Filled 2018-01-14: qty 10

## 2018-01-14 MED ORDER — ZOLPIDEM TARTRATE 5 MG PO TABS
5.0000 mg | ORAL_TABLET | Freq: Every evening | ORAL | Status: DC | PRN
  Administered 2018-01-14 – 2018-01-18 (×4): 5 mg via ORAL
  Filled 2018-01-14 (×4): qty 1

## 2018-01-14 MED ORDER — PANTOPRAZOLE SODIUM 40 MG IV SOLR
40.0000 mg | Freq: Two times a day (BID) | INTRAVENOUS | Status: DC
  Administered 2018-01-14 (×2): 40 mg via INTRAVENOUS
  Filled 2018-01-14 (×2): qty 40

## 2018-01-14 MED ORDER — LEVALBUTEROL HCL 1.25 MG/0.5ML IN NEBU
1.2500 mg | INHALATION_SOLUTION | Freq: Four times a day (QID) | RESPIRATORY_TRACT | Status: DC
  Administered 2018-01-14 (×3): 1.25 mg via RESPIRATORY_TRACT
  Filled 2018-01-14 (×5): qty 0.5

## 2018-01-14 MED ORDER — METHYLPREDNISOLONE SODIUM SUCC 125 MG IJ SOLR
60.0000 mg | INTRAMUSCULAR | Status: DC
  Administered 2018-01-14: 60 mg via INTRAVENOUS
  Filled 2018-01-14: qty 2

## 2018-01-14 MED ORDER — DM-GUAIFENESIN ER 30-600 MG PO TB12: 1.0000 | ORAL_TABLET | Freq: Two times a day (BID) | ORAL | Status: DC | PRN

## 2018-01-14 NOTE — Progress Notes (Signed)
Pt seen and examined, admitted earlier this morning by Dr.Niu Dana Pham is 84 year old female with history of COPD, suspected CHF, hypothyroidism, GERD presented to the emergency room with worsening dyspnea 1 week. She's been staying with her sister in [place] for the last 2-3 weeks, originally from [place]. -Patient also reports mild intermittent bright red blood per rectum, off and on for years couple episodes this past week. -Very poor historian, unfortunately no records in Epic  1. Acute on chronic respiratory failure -Suspect secondary to CHF exacerbation/volume overload -Also has underlying COPD, continue nebs for now, no audible wheezing at this time, chest x-ray, notable for atelectasis and emphysema -IV Lasix 1 -Check 2-D echocardiogram -Nephrology consult due to concomitant renal failure -I [facility] in [place]  2. Acute kidney injury -Creatinine is 5, baseline unknown, could be cardiorenal -Microalbuminuria-protein 30 -renal ultrasound without hydronephrosis -We will ask nephrology for input, I have requested [facility] which hopefully will give us information about her baseline labs/creatinine  3. Chronic intermittent hematochezia -Per report this is mild -History not suggestive of upper GI bleed, change IV Protonix to by mouth -Check anemia panel -If active bleeding ensues will request GI evaluation inpatient otherwise needs outpatient colonoscopy -Hb is 10, monitor  4. COPD/chronic resp failure on 3L Home O2 -no wheezes, continue nebs  5. Hyponatremia -Could be due to mildly volume overloaded state -IV Lasix 1 and monitor  6. Hypothyroidism -Continue Synthroid, follow-up TSH  Zannie CovePreetha Marien Manship, MD

## 2018-01-14 NOTE — Evaluation (Addendum)
Physical Therapy Evaluation Patient Details Name: Dana Pham MRN: [id number] DOB: 04-Dec-1931 Today's Date: 01/14/2018   History of Present Illness  Pt is an 84 y.o. female admitted 01/14/18 with worsening dyspnea. Worked up for acute on chronic respiratory failure suspect secondary to CHF; underlying COPD. Pt also with AKI and chronic intermittent hematochezia. PMH includes HTN, COPD (3L home O2).     Clinical Impression  Pt presents with an overall decrease in functional mobility secondary to above. PTA, pt mod indep ambulating short distances with RW; lives with sister who assists with household tasks. Today, pt easily fatigued by using bathroom and ambulating short distance in room; required RW and min guard for balance. DOE 3/4 while walking; SpO2 96% on RA. Pt at significant risk for falls due to generalized weakness, decreased activity tolerance, and slowed gait speed. Pt would benefit from SNF-level therapies to maximize functional mobility and independence prior to return home; pt in agreement with this. Will follow acutely to address established goals.    Follow Up Recommendations SNF;Supervision for mobility/OOB    Equipment Recommendations  None recommended by PT    Recommendations for Other Services       Precautions / Restrictions Precautions Precautions: Fall Restrictions Weight Bearing Restrictions: No      Mobility  Bed Mobility               General bed mobility comments: Received using bathroom with assist from NT  Transfers Overall transfer level: Needs assistance   Transfers: Sit to/from Stand Sit to Stand: Min assist         General transfer comment: MinA for trunk elevation to stand from toilet; reliance on UE support using grab bar  Ambulation/Gait Ambulation/Gait assistance: Min guard Gait Distance (Feet): 15 Feet Assistive device: Rolling walker (2 wheeled) Gait Pattern/deviations: Step-to pattern;Trunk flexed;Shuffle Gait velocity:  Decreased Gait velocity interpretation: <1.31 ft/sec, indicative of household ambulator General Gait Details: Slow, shuffling amb with RW and min guard. Pt easily fatigued by short distances; DOE 3/4. SpO2 96% on RA  Stairs            Wheelchair Mobility    Modified Rankin (Stroke Patients Only)       Balance Overall balance assessment: Needs assistance   Sitting balance-Leahy Scale: Good       Standing balance-Leahy Scale: Fair Standing balance comment: Can static stand and take a few steps with no UE support and min guard for balance                             Pertinent Vitals/Pain Pain Assessment: No/denies pain    Home Living Family/patient expects to be discharged to:: Skilled nursing facility Living Arrangements: Other relatives(Sister) Available Help at Discharge: Family;Available 24 hours/day           Home Equipment: Walker - 2 wheels;Walker - 4 wheels Additional Comments: Wears 3L O2 at home    Prior Function Level of Independence: Needs assistance   Gait / Transfers Assistance Needed: Pt reports mod indep with rollator for short distances. Does not drive  ADL's / Homemaking Assistance Needed: Reports mod indep with ADLs. Lives with sister who helps with cooking/household tasks        Hand Dominance        Extremity/Trunk Assessment   Upper Extremity Assessment Upper Extremity Assessment: Generalized weakness    Lower Extremity Assessment Lower Extremity Assessment: RLE deficits/detail;LLE deficits/detail RLE Deficits / Details: Hip  flex 3/5, knee flex/ext 4/5, ankle 4/5 LLE Deficits / Details: Hip flex 3/5, knee flex/ext 4/5, ankle 4/5    Cervical / Trunk Assessment Cervical / Trunk Assessment: Kyphotic  Communication   Communication: HOH  Cognition Arousal/Alertness: Awake/alert Behavior During Therapy: WFL for tasks assessed/performed Overall Cognitive Status: Within Functional Limits for tasks assessed                                         General Comments      Exercises     Assessment/Plan    PT Assessment Patient needs continued PT services  PT Problem List Decreased strength;Decreased activity tolerance;Decreased balance;Decreased mobility;Cardiopulmonary status limiting activity       PT Treatment Interventions DME instruction;Gait training;Stair training;Functional mobility training;Therapeutic activities;Therapeutic exercise;Balance training;Patient/family education    PT Goals (Current goals can be found in the Care Plan section)  Acute Rehab PT Goals Patient Stated Goal: Get stronger at rehab before returning home PT Goal Formulation: With patient Time For Goal Achievement: 01/28/18 Potential to Achieve Goals: Good    Frequency Min 2X/week   Barriers to discharge        Co-evaluation               AM-PAC PT "6 Clicks" Daily Activity  Outcome Measure Difficulty turning over in bed (including adjusting bedclothes, sheets and blankets)?: Unable Difficulty moving from lying on back to sitting on the side of the bed? : Unable Difficulty sitting down on and standing up from a chair with arms (e.g., wheelchair, bedside commode, etc,.)?: A Little Help needed moving to and from a bed to chair (including a wheelchair)?: A Little Help needed walking in hospital room?: A Little Help needed climbing 3-5 steps with a railing? : A Lot 6 Click Score: 13    End of Session   Activity Tolerance: Patient tolerated treatment well;Patient limited by fatigue Patient left: in chair;with call bell/phone within reach;with family/visitor present Nurse Communication: Mobility status PT Visit Diagnosis: Other abnormalities of gait and mobility (R26.89);Muscle weakness (generalized) (M62.81)    Time: 1610-9604 PT Time Calculation (min) (ACUTE ONLY): 19 min   Charges:   PT Evaluation $PT Eval Moderate Complexity: 1 Mod     PT G Codes:       Ina Homes, PT,  DPT Acute Rehab Services  Pager: [phone]  Malachy Chamber 01/14/2018, 12:05 PM

## 2018-01-14 NOTE — H&P (Addendum)
History and Physical    Dana Pham DPO:[id number] DOB: 04/11/1932 DOA: 01/14/2018  Referring MD/NP/PA:   PCP: Patient, No Pcp Per   Patient coming from:  The patient is coming from home with her sister.  At baseline, pt is partially dependent for most of ADL.   Chief Complaint: SOB, left pain and leg swelling, bloody stool.  HPI: Dana Pham is a 84 y.o. female with medical history significant of hypertension, COPD, GERD, hypothyroidism, possible CHF (patient is not taking Lasix), who presents with shortness of breath, leg pain and leg swelling, bloody stool.  Pt recently moved her from [facility] to live with her sister two weeks ago. Patient is a poor historian. The history is limited. The medical record is being requested from [facility].   Pt states that she has hx of COPD and her shortness of breath has worsened for about 1 week.  She has dry cough, denies any chest pain, fever or chills.  Her sister states that patient fell a week ago, no head injury, no LOC, but since then she developed bilateral lower leg pain. Her lower leg has been swollen and erythematous.  Patient denies nausea, vomiting, diarrhea or abdominal pain, but reporting bloody stool in the past several days.  She states that the blood in stool is bright red.  She states that she possibly had EGD in the past, but not very sure.  She never had colonoscopy.  Patient does not have symptoms of UTI or unilateral weakness. Per EDP, pt had wheezing usually.  Currently patient does not have wheezing.  Of note, patient states that she has low blood pressure reading in left arm and high blood pressure reading in right arm.  She was told that her blood pressure needed to be measured in the right arm.  ED Course: pt was found to have BNP 551, WBC 14.6, creatinine 5.00, BUN 56 (previous creatinine 0.83 on 01/21/2009), hyponatremia with sodium 129, temperature  99, heart rate is 70-120s, slightly tachypnea, oxygen saturation 100% on 2 L nasal cannula oxygen, blood pressure 159/63 here right arm, 87/57 in left arm. Pt is admitted to telemetry bed as inpatient.  CXR showed 1. No acute cardiopulmonary process.  Emphysema. 2. Nodular appearing density in the right lower lobe most likely artifact and related to pulmonary vasculature per radiologist CT may provide better evaluation. 3.  Aortic Atherosclerosis (ICD10-I70.0).  Review of Systems:   General: no fevers, chills, has poor appetite, has fatigue HEENT: no blurry vision, hearing changes or sore throat Respiratory: has dyspnea, coughing, wheezing CV: no chest pain, no palpitations GI: no nausea, vomiting, abdominal pain, diarrhea, constipation. Has bloody stool GU: no dysuria, burning on urination, increased urinary frequency, hematuria  Ext: has leg edema Neuro: no unilateral weakness, numbness, or tingling, no vision change or hearing loss Skin: no skin tear. Has erythema in both lower legs MSK: Bilateral lower leg pain Heme: No easy bruising.  Travel history: No recent long distant travel.  Allergy:  Allergies  Allergen Reactions  . Latex Rash    Redness, also    Past Medical History:  Diagnosis Date  . COPD (chronic obstructive pulmonary disease) ([place])   . Essential hypertension   . GERD (gastroesophageal reflux disease)   . Hypothyroidism     Past Surgical History:  Procedure Laterality Date  . Bilateral hip replacement     Patient states that I had my gallbladder removed  .  CHOLECYSTECTOMY    . Right knee replacement      Social History:  reports that she has never smoked. She has never used smokeless tobacco. She reports that she drank alcohol. She reports that she has current or past drug history.  Family History:  Family History  Problem Relation Age of Onset  . Dementia Father   . Hypertension Sister   . Diabetes Mellitus I Sister   . Heart disease Sister        Prior to Admission medications   Not on File    Physical Exam: Vitals:   01/14/18 0145 01/14/18 0300 01/14/18 0315 01/14/18 0500  BP: (!) 148/76 (!) 149/77 (!) 148/88 (!) 154/56  Pulse: 97 92 (!) 102 93  Resp:    (!) 28  Temp:    97.6 F (36.4 C)  TempSrc:    Oral  SpO2: 100% 96% 96% 99%  Weight:      Height:       General: Not in acute distress HEENT:       Eyes: PERRL, EOMI, no scleral icterus.       ENT: No discharge from the ears and nose, no pharynx injection, no tonsillar enlargement.        Neck: No JVD, no bruit, no mass felt. Heme: No neck lymph node enlargement. Cardiac: S1/S2, RRR, No murmurs, No gallops or rubs. Respiratory: has rhonchi bilaterally (per ED physician, patient had wheezing initially, currently no wheezing). GI: Soft, nondistended, nontender, no rebound pain, no organomegaly, BS present. GU: No hematuria Ext: 2+ pitting leg edema bilaterally. 2+DP/PT pulse bilaterally. Musculoskeletal: No joint deformities, No joint redness or warmth, no limitation of ROM in spin. Skin: Patient has bilateral leg edema, erythema and tenderness Neuro: Alert, oriented X3, cranial nerves II-XII grossly intact, moves all extremities normally.  Psych: Patient is not psychotic, no suicidal or hemocidal ideation.  Labs on Admission: I have personally reviewed following labs and imaging studies  CBC: Recent Labs  Lab 01/14/18 0043 01/14/18 0113 01/14/18 0123  WBC 14.6*  --   --   NEUTROABS  --  12.0*  --   HGB 9.6*  --  10.5*  HCT 30.1*  --  31.0*  MCV 77.6*  --   --   PLT 311  --   --    Basic Metabolic Panel: Recent Labs  Lab 01/14/18 0043 01/14/18 0123  NA 129* 129*  K 3.6 3.6  CL 97* 97*  CO2 18*  --   GLUCOSE 106* 104*  BUN 56* 56*  CREATININE 4.72* 5.00*  CALCIUM 8.0*  --    GFR: Estimated Creatinine Clearance: 7.4 mL/min (A) (by C-G formula based on SCr of 5 mg/dL (H)). Liver Function Tests: No results for input(s): AST, ALT, ALKPHOS,  BILITOT, PROT, ALBUMIN in the last 168 hours. No results for input(s): LIPASE, AMYLASE in the last 168 hours. No results for input(s): AMMONIA in the last 168 hours. Coagulation Profile: No results for input(s): INR, PROTIME in the last 168 hours. Cardiac Enzymes: No results for input(s): CKTOTAL, CKMB, CKMBINDEX, TROPONINI in the last 168 hours. BNP (last 3 results) No results for input(s): PROBNP in the last 8760 hours. HbA1C: No results for input(s): HGBA1C in the last 72 hours. CBG: No results for input(s): GLUCAP in the last 168 hours. Lipid Profile: No results for input(s): CHOL, HDL, LDLCALC, TRIG, CHOLHDL, LDLDIRECT in the last 72 hours. Thyroid Function Tests: No results for input(s): TSH, T4TOTAL, FREET4, T3FREE, THYROIDAB in the  last 72 hours. Anemia Panel: No results for input(s): VITAMINB12, FOLATE, FERRITIN, TIBC, IRON, RETICCTPCT in the last 72 hours. Urine analysis: No results found for: COLORURINE, APPEARANCEUR, LABSPEC, PHURINE, GLUCOSEU, HGBUR, BILIRUBINUR, KETONESUR, PROTEINUR, UROBILINOGEN, NITRITE, LEUKOCYTESUR Sepsis Labs: @LABRCNTIP (procalcitonin:4,lacticidven:4) )No results found for this or any previous visit (from the past 240 hour(s)).   Radiological Exams on Admission: Dg Chest 2 View  Result Date: 01/14/2018 CLINICAL DATA:  84 year old female with shortness of breath. EXAM: CHEST - 2 VIEW COMPARISON:  Chest radiograph dated 01/21/2009 FINDINGS: There is emphysematous changes of the lungs with flattening of the diaphragms and increased AP diameter of the chest. There is no focal consolidation, pleural effusion, or pneumothorax. A 1.9 cm nodular appearing density in the right lower lung field likely corresponds to vascular structure seen on the lateral view. A pulmonary nodule is less likely but not entirely excluded. This can be further evaluated with chest CT. There is no focal consolidation, pleural effusion, or pneumothorax. Borderline cardiomegaly.  Atherosclerotic calcification of the aorta. No acute osseous pathology. IMPRESSION: 1. No acute cardiopulmonary process.  Emphysema. 2. Nodular appearing density in the right lower lobe most likely artifact and related to pulmonary vasculature. CT may provide better evaluation. 3.  Aortic Atherosclerosis (ICD10-I70.0). Electronically Signed   By: Anner Crete M.D.   On: 01/14/2018 01:32   Dg Tibia/fibula Left  Result Date: 01/14/2018 CLINICAL DATA:  Bilateral lower leg pain after falling EXAM: LEFT TIBIA AND FIBULA - 2 VIEW COMPARISON:  None. FINDINGS: There is no evidence of fracture or other focal bone lesions. Soft tissues are unremarkable. IMPRESSION: Negative. Electronically Signed   By: Ulyses Jarred M.D.   On: 01/14/2018 04:35   Dg Tibia/fibula Right  Result Date: 01/14/2018 CLINICAL DATA:  84 year old female with bilateral lower extremity pain. EXAM: RIGHT TIBIA AND FIBULA - 2 VIEW COMPARISON:  Right lower extremity radiograph dated 01/14/2018 FINDINGS: There is no acute fracture or dislocation. The bones are osteopenic. Total right hip arthroplasty appears in anatomic alignment. There is mild diffuse subcutaneous edema. IMPRESSION: 1. No acute fracture or dislocation. 2. Osteopenia. 3. Mild diffuse subcutaneous edema. Electronically Signed   By: Anner Crete M.D.   On: 01/14/2018 04:47   US Renal  Result Date: 01/14/2018 CLINICAL DATA:  84 year old female with acute renal insufficiency. EXAM: RENAL / URINARY TRACT ULTRASOUND COMPLETE COMPARISON:  None. FINDINGS: Right Kidney: Length: 10.3 cm. Normal echogenicity. No hydronephrosis or shadowing stone. Left Kidney: Length: 10.6 cm. There is a 3.0 x 3.2 x 3.2 cm upper pole cyst. No hydronephrosis or shadowing stone. Bladder: The urinary bladder is not visualized. IMPRESSION: No hydronephrosis or shadowing stone. Left renal cyst. Electronically Signed   By: Anner Crete M.D.   On: 01/14/2018 05:10     EKG: Independently reviewed.   Sinus rhythm, tachycardia, QTC 465, PAC, nonspecific T wave change.   Assessment/Plan Principal Problem:   Acute on chronic respiratory failure with hypoxia (HCC) Active Problems:   Hyponatremia   AKI (acute kidney injury) ([place])   Microcytic anemia   Positive fecal occult blood test   Bilateral leg pain   GIB (gastrointestinal bleeding)   Fall   COPD exacerbation (HCC)   Acute on chronic respiratory failure with hypoxia ([place]): Likely due to combination of COPD exacerbation and possible CHF exacerbation.  Patient initially had wheezing, currently has rhonchi, indicating COPD exacerbation.  Not sure if patient has history of CHF, but she is taking Lasix at home, and has bilateral leg edema, elevated BNP  551, indicating possible CHF exacerbation.  -will admit patient to telemetry bed  -Nasal cannula oxygen as needed to maintain O2 saturation 92% or greater -Nebulizers: scheduled Atrovent and prn Xopenex Nebs -Dulera inhaler -Solu-Medrol 60 mg IV bid -on rocephin which also for possible leg cullulitis -Mucinex for cough  -Incentive spirometry -Urine S. pneumococcal antigen -Follow up blood culture x2, sputum culture, respiratory virus panel -pt needs IV lasix for possible CHF exacerbation, but pt has Cre 5.0 and hyponatremia, not sure about her recent baseline creatinine level.  Medical record is requested, will wait for medical record to clarify baseline renal function before starting IV Lasix. -will get 2d echo -consult to SW and CM for possible placement -keep Pt NPO due to GIB  Bilateral leg pain and swelling: Likely due to combination of CHF and possible cellulitis.  Also need to rule out bone fracture since she had fall recently. -PRN Tylenol and Percocet for pain -IV Rocephin for possible cellulitis -CRP, ESR -follow-up blood culture, -X-ray of tibia/fibula for both leg-->negative for Fx. -LE doppler to r/o DVT -will get Procalcitonin and trend lactic acid level   Fall:  Denies head or neck injury.  No head and neck pain. - PT/OT  Hyponatremia: Na=129.  Likely due to combination of dehydration and Lasix use -Hold Lasix -Fluid restriction -Will not give IV sodium chloride due to fluid overload -check TSH  Hypothyroidism: Last TSH was not on record -Continue home Synthroid -Check TSH  AKI (acute kidney injury) [facility]): Creatinine 5.0, BUN 56.  Not sure her frecent baseline renal function. Her Cre was 0.83 on 01/21/2009. -hold lasix -FeUrea -US-renal-->no hydronephrosis.  Microcytic anemia, positive fecal occult blood test and GIB: Hgb 14.9 on 01/21/09-->10.5 today. - hold ASA - NPO now - Start IV pantoprazole 40 mg bid - Zofran IV for nausea - Avoid NSAIDs and SQ heparin - Maintain IV access (2 large bore IVs if possible). - Monitor closely and follow q6h cbc, transfuse as necessary, if Hgb<7.0 - LaB: INR, PTT and type screen - please call GI in AM   DVT ppx: none (due to possible GI bleeding, cannot use heparin/Lovenox; needed to rule out DVT before started SCD).   Code Status: DNR (I discussed with patient in the presence of her sister and explained the meaning of CODE STATUS. Patient wants to be DNR)  Family Communication:   Yes, patient's sister   at bed side Disposition Plan: to be determined Consults called:  none Admission status:  Inpatient/tele    Date of Service 01/14/2018    Ivor Costa [facility] Pager [phone]  If 7PM-7AM, please contact night-coverage [email or web address] Password [email or web address] 01/14/2018, 6:38 AM

## 2018-01-14 NOTE — ED Triage Notes (Signed)
Patient c/o SOB x1 week. Denies CP,N/V or fever. Hx of COPD

## 2018-01-14 NOTE — Progress Notes (Signed)
  Echocardiogram 2D Echocardiogram has been performed.  Dorothey BasemanReel, Tejah Brekke M 01/14/2018, 2:41 PM

## 2018-01-14 NOTE — Consult Note (Addendum)
Renal Service Consult Note [facility]  Dana BentonDorothy W Pham 01/14/2018 Dana Krabbeobert D Saiquan Pham Requesting Physician:  Dr Dana Pham, Dana CharityP.   Reason for Consult:  Acute renal failure HPI: The patient is a 84 y.o. year-old with hx of HTN, gerd, COPD and hypothyroidism presented to ED overnight w/ SOB x 1 week, also bloody stool, leg pain and leg swelling.  Hx of COPD. In ED BP's were slightly high, HR 90's, RR 24, low grade temp to 99.4. CXR showed COPD but no acute process.  Pt recently moved her from [place] to live w/ her sister.  Poor historian.  Asked to see for renal failure.   Pt denies any history of renal failure, voiding issues, hematuria, urine color changes or dysuria.  Has taken lasix for swollen feet, not taking at this time.  No flank pain or fevers.    Renal US shows normal sized kidneys w/o hydro.  UA shows 30 prot, 0-5 rbc and 6-10 wbcs.    ROS  denies CP  no joint pain   no HA  no blurry vision  no rash  no diarrhea  no nausea/ vomiting   Past Medical History  Past Medical History:  Diagnosis Date  . COPD (chronic obstructive pulmonary disease) (HCC)   . Essential hypertension   . GERD (gastroesophageal reflux disease)   . Hypothyroidism    Past Surgical History  Past Surgical History:  Procedure Laterality Date  . Bilateral hip replacement     Patient states that I had my gallbladder removed  . CHOLECYSTECTOMY    . Right knee replacement     Family History  Family History  Problem Relation Age of Onset  . Dementia Father   . Hypertension Sister   . Diabetes Mellitus I Sister   . Heart disease Sister    Social History  reports that she has never smoked. She has never used smokeless tobacco. She reports that she drank alcohol. She reports that she has current or past drug history. Allergies  Allergies  Allergen Reactions  . Latex Rash    Redness, also   Home medications Prior to Admission medications   Medication Sig Start Date End Date  Taking? Authorizing Provider  albuterol (VENTOLIN HFA) 108 (90 Base) MCG/ACT inhaler Inhale 2 puffs into the lungs every 4 (four) hours as needed for wheezing or shortness of breath.   Yes [provider]  amLODipine (NORVASC) 10 MG tablet Take 10 mg by mouth daily.   Yes [provider]  aspirin EC 81 MG tablet Take 81 mg by mouth daily.   Yes [provider]  budesonide-formoterol (SYMBICORT) 160-4.5 MCG/ACT inhaler Inhale 2 puffs into the lungs 2 (two) times daily.   Yes [provider]  furosemide (LASIX) 20 MG tablet Take 20 mg by mouth daily.   Yes [provider]  levothyroxine (SYNTHROID, LEVOTHROID) 25 MCG tablet Take 25 mcg by mouth daily before breakfast.   Yes [provider]  pantoprazole (PROTONIX) 40 MG tablet Take 40 mg by mouth daily before breakfast.   Yes [provider]  Tiotropium Bromide Monohydrate (SPIRIVA RESPIMAT) 2.5 MCG/ACT AERS Inhale 2 puffs into the lungs daily.   Yes [provider]   Liver Function Tests No results for input(s): AST, ALT, ALKPHOS, BILITOT, PROT, ALBUMIN in the last 168 hours. No results for input(s): LIPASE, AMYLASE in the last 168 hours. CBC Recent Labs  Lab 01/14/18 0043 01/14/18 0113 01/14/18 0123 01/14/18 0635 01/14/18 1011  WBC  14.6*  --   --  12.0* 11.4*  NEUTROABS  --  12.0*  --   --   --   HGB 9.6*  --  10.5* 10.1* 10.4*  HCT 30.1*  --  31.0* 31.8* 32.1*  MCV 77.6*  --   --  76.4* 75.4*  PLT 311  --   --  318 361   Basic Metabolic Panel Recent Labs  Lab 01/14/18 0043 01/14/18 0123  NA 129* 129*  K 3.6 3.6  CL 97* 97*  CO2 18*  --   GLUCOSE 106* 104*  BUN 56* 56*  CREATININE 4.72* 5.00*  CALCIUM 8.0*  --    Iron/TIBC/Ferritin/ %Sat    Component Value Date/Time   IRON 14 (L) 01/14/2018 0635   TIBC 269 01/14/2018 0635   FERRITIN 192 01/14/2018 0635   IRONPCTSAT 5 (L) 01/14/2018 0635    Vitals:   01/14/18 0747 01/14/18 0811 01/14/18 1346  01/14/18 1607  BP:  (!) 151/74  (!) 148/78  Pulse:  85  100  Resp:  20  19  Temp:  98.4 F (36.9 C)  97.8 F (36.6 C)  TempSrc:  Oral  Oral  SpO2: 97% 98% 96% 97%  Weight:      Height:       Exam Gen elderly wf no distress, coughs frequently No rash, cyanosis or gangrene Sclera anicteric, throat clear  No jvd or bruits, neck veins flat Chest good air movement bilat, no wheezing or rales RRR no MRG Abd soft ntnd no mass or ascites +bs GU defer MS no joint effusions or deformity Ext trace bilat pretib edema, no wounds or ulcers Neuro is alert, Ox 3 , nf    Home meds:  - norvasc 10/ lasix 20 qd  - spiriva respimat 2 puff qd/ symbicort 2 puffs bid/ alb nebs prn  - ecasa 81/ synthroid 25 ug/ PPI   renal US 6/30 > 10- 11 cm kidneys, normal echo, no hydro  echo 6/30 > normal LVEF 60%, G1DD, no sig valve dz    Na 129  K 3.6  BUN 56 Cr 5.00  CO2 18  Glu 122  Ca 8.0    CPK 1256  BNP 551  Trop 0.03  CRP 18  WBC 11k Hb 10.4  plt 361   UA > clear straw , 30 protein, 0-5 rbc and 6-10 wbc, rare bact  Urine Na 47   Uosm 185   Impression: 1  Renal failure - creat 5 in patient from out of town, here 3 weeks.  No hx renal failure, sees physician regularly in [place]. UA not impressive and renal US looks normal.  Suspect his may be acute.  Will give IVF's, check some serologies, liver panel and myeloma screens.  Hopefully will improve w/ IVF's.  Get records from PMD Monday if possible Dana Pham Dana Pham [address] [place], [place]. [address] [phone]).  Would not make a great HD candidate w/ her age and severe COPD but did not go into this in detail.   2  COPD - on home O2 10 yrs 3  HTN - BP's up a bit , on norvasc at home 4  Hypothyroidism   Plan - as above  Dana Moselle MD [employer] pager [phone]   01/14/2018, 5:01 PM

## 2018-01-14 NOTE — Plan of Care (Signed)
Discussed with patient plan of care, pain management and need for a primary doctor in this area with some teach back displayed

## 2018-01-14 NOTE — ED Provider Notes (Signed)
[facility] EMERGENCY DEPARTMENT Provider Note   CSN: [id number] Arrival date & time: 01/14/18  0024     History   Chief Complaint Chief Complaint  Patient presents with  . Shortness of Breath    HPI Dana Pham is a 84 y.o. female.  The history is provided by the patient and a relative.  Shortness of Breath  This is a chronic (acute on chronic) problem. The average episode lasts 5 days. The problem occurs continuously.The current episode started more than 2 days ago. The problem has been gradually worsening. Associated symptoms include cough and wheezing. Pertinent negatives include no fever, no headaches, no rhinorrhea, no sore throat, no swollen glands, no neck pain, no chest pain and no leg swelling.    History reviewed. No pertinent past medical history.  Patient Active Problem List   Diagnosis Date Noted  . COPD with acute exacerbation (HCC) 01/14/2018  . Hyponatremia 01/14/2018  . AKI (acute kidney injury) (HCC) 01/14/2018  . Microcytic anemia 01/14/2018  . Positive fecal occult blood test 01/14/2018  . Bilateral leg pain 01/14/2018  . Acute on chronic respiratory failure with hypoxia (HCC) 01/14/2018  . GIB (gastrointestinal bleeding) 01/14/2018  . Fall 01/14/2018  . COPD exacerbation (HCC) 01/14/2018    History reviewed. No pertinent surgical history.   OB History   None      Home Medications    Prior to Admission medications   Medication Sig Start Date End Date Taking? Authorizing Provider  albuterol (VENTOLIN HFA) 108 (90 Base) MCG/ACT inhaler Inhale 2 puffs into the lungs every 4 (four) hours as needed for wheezing or shortness of breath.   Yes [provider]  amLODipine (NORVASC) 10 MG tablet Take 10 mg by mouth daily.   Yes [provider]  aspirin EC 81 MG tablet Take 81 mg by mouth daily.   Yes [provider]  budesonide-formoterol (SYMBICORT) 160-4.5 MCG/ACT inhaler Inhale 2 puffs into the lungs  2 (two) times daily.   Yes [provider]  furosemide (LASIX) 20 MG tablet Take 20 mg by mouth daily.   Yes [provider]  levothyroxine (SYNTHROID, LEVOTHROID) 25 MCG tablet Take 25 mcg by mouth daily before breakfast.   Yes [provider]  pantoprazole (PROTONIX) 40 MG tablet Take 40 mg by mouth daily before breakfast.   Yes [provider]  Tiotropium Bromide Monohydrate (SPIRIVA RESPIMAT) 2.5 MCG/ACT AERS Inhale 2 puffs into the lungs daily.   Yes [provider]    Family History No family history on file.  Social History Social History   Tobacco Use  . Smoking status: Not on file  Substance Use Topics  . Alcohol use: Not on file  . Drug use: Not on file     Allergies   Latex   Review of Systems Review of Systems  Constitutional: Negative for appetite change, diaphoresis and fever.  HENT: Negative for congestion, rhinorrhea and sore throat.   Eyes: Negative for photophobia.  Respiratory: Positive for cough, shortness of breath and wheezing. Negative for chest tightness.   Cardiovascular: Negative for chest pain and leg swelling.  Gastrointestinal: Positive for anal bleeding.  Genitourinary: Negative for flank pain.  Musculoskeletal: Negative for neck pain and neck stiffness.  Neurological: Negative for facial asymmetry, light-headedness and headaches.  Psychiatric/Behavioral: Negative for dysphoric mood.  All other systems reviewed and are negative.    Physical Exam Updated Vital Signs BP (!) 148/88   Pulse (!) 102  Temp 99 F (37.2 C) (Oral)   Resp 16   Ht 5\' 4"  (1.626 m)   Wt 59.4 kg (131 lb)   SpO2 96%   BMI 22.49 kg/m   Physical Exam  Constitutional: She is oriented to person, place, and time. She appears well-developed and well-nourished.  HENT:  Head: Normocephalic and atraumatic.  Mouth/Throat: No oropharyngeal exudate.  Eyes: Pupils are equal, round, and reactive to light. Conjunctivae are  normal.  Neck: Normal range of motion. Neck supple.  Cardiovascular: Normal rate, regular rhythm, normal heart sounds and intact distal pulses.  Pulmonary/Chest: Tachypnea noted. She has decreased breath sounds. She has wheezes. She has no rales.  Abdominal: Soft. Bowel sounds are normal. She exhibits no mass. There is no tenderness. There is no rebound and no guarding.  Musculoskeletal: She exhibits edema.  Neurological: She is alert and oriented to person, place, and time. She displays normal reflexes.  Skin: Skin is warm and dry. Capillary refill takes less than 2 seconds. She is not diaphoretic. No erythema.  No warmth   Psychiatric: She has a normal mood and affect.     ED Treatments / Results  Labs (all labs ordered are listed, but only abnormal results are displayed) Labs Reviewed  BASIC METABOLIC PANEL - Abnormal; Notable for the following components:      Result Value   Sodium 129 (*)    Chloride 97 (*)    CO2 18 (*)    Glucose, Bld 106 (*)    BUN 56 (*)    Creatinine, Ser 4.72 (*)    Calcium 8.0 (*)    GFR calc non Af Amer 8 (*)    GFR calc Af Amer 9 (*)    All other components within normal limits  CBC - Abnormal; Notable for the following components:   WBC 14.6 (*)    Hemoglobin 9.6 (*)    HCT 30.1 (*)    MCV 77.6 (*)    MCH 24.7 (*)    RDW 16.1 (*)    All other components within normal limits  DIFFERENTIAL - Abnormal; Notable for the following components:   Neutro Abs 12.0 (*)    All other components within normal limits  BRAIN NATRIURETIC PEPTIDE - Abnormal; Notable for the following components:   B Natriuretic Peptide 551.4 (*)    All other components within normal limits  I-STAT CHEM 8, ED - Abnormal; Notable for the following components:   Sodium 129 (*)    Chloride 97 (*)    BUN 56 (*)    Creatinine, Ser 5.00 (*)    Glucose, Bld 104 (*)    Calcium, Ion 1.06 (*)    TCO2 19 (*)    Hemoglobin 10.5 (*)    HCT 31.0 (*)    All other components within  normal limits  POC OCCULT BLOOD, ED - Abnormal; Notable for the following components:   Fecal Occult Bld POSITIVE (*)    All other components within normal limits  RESPIRATORY PANEL BY PCR  CULTURE, BLOOD (ROUTINE X 2)  CULTURE, BLOOD (ROUTINE X 2)  CULTURE, EXPECTORATED SPUTUM-ASSESSMENT  GRAM STAIN  OSMOLALITY, URINE  OSMOLALITY  SODIUM, URINE, RANDOM  UREA NITROGEN, URINE  TSH  VITAMIN B12  FOLATE  IRON AND TIBC  FERRITIN  RETICULOCYTES  LACTIC ACID, PLASMA  LACTIC ACID, PLASMA  PROCALCITONIN  PROTIME-INR  APTT  SEDIMENTATION RATE  C-REACTIVE PROTEIN  URINALYSIS, ROUTINE W REFLEX MICROSCOPIC  CBC  CBC  CBC  CBC  HIV ANTIBODY (ROUTINE TESTING)  STREP PNEUMONIAE URINARY ANTIGEN  I-STAT TROPONIN, ED  TYPE AND SCREEN  ABO/RH    EKG EKG Interpretation  Date/Time:  Sunday January 14 2018 00:33:04 EDT Ventricular Rate:  105 PR Interval:    QRS Duration: 70 QT Interval:  352 QTC Calculation: 465 R Axis:   59 Text Interpretation:  Sinus rhythm with Premature supraventricular complexes Confirmed by Danali Marinos ([id number]) on 01/14/2018 1:06:54 AM   Radiology Dg Chest 2 View  Result Date: 01/14/2018 CLINICAL DATA:  84 year old female with shortness of breath. EXAM: CHEST - 2 VIEW COMPARISON:  Chest radiograph dated 01/21/2009 FINDINGS: There is emphysematous changes of the lungs with flattening of the diaphragms and increased AP diameter of the chest. There is no focal consolidation, pleural effusion, or pneumothorax. A 1.9 cm nodular appearing density in the right lower lung field likely corresponds to vascular structure seen on the lateral view. A pulmonary nodule is less likely but not entirely excluded. This can be further evaluated with chest CT. There is no focal consolidation, pleural effusion, or pneumothorax. Borderline cardiomegaly. Atherosclerotic calcification of the aorta. No acute osseous pathology. IMPRESSION: 1. No acute cardiopulmonary process.  Emphysema. 2.  Nodular appearing density in the right lower lobe most likely artifact and related to pulmonary vasculature. CT may provide better evaluation. 3.  Aortic Atherosclerosis (ICD10-I70.0). Electronically Signed   By: Elgie CollardArash  Radparvar M.D.   On: 01/14/2018 01:32    Procedures Procedures (including critical care time)  Medications Ordered in ED Medications  cefTRIAXone (ROCEPHIN) 1 g in sodium chloride 0.9 % 100 mL IVPB (has no administration in time range)  methylPREDNISolone sodium succinate (SOLU-MEDROL) 125 mg/2 mL injection 60 mg (has no administration in time range)  amLODipine (NORVASC) tablet 10 mg (has no administration in time range)  aspirin EC tablet 81 mg (has no administration in time range)  mometasone-formoterol (DULERA) 200-5 MCG/ACT inhaler 2 puff (has no administration in time range)  levothyroxine (SYNTHROID, LEVOTHROID) tablet 25 mcg (has no administration in time range)  pantoprazole (PROTONIX) EC tablet 40 mg (has no administration in time range)  levalbuterol (XOPENEX) nebulizer solution 1.25 mg (has no administration in time range)  dextromethorphan-guaiFENesin (MUCINEX DM) 30-600 MG per 12 hr tablet 1 tablet (has no administration in time range)  pantoprazole (PROTONIX) injection 40 mg (has no administration in time range)  ondansetron (ZOFRAN) injection 4 mg (has no administration in time range)  hydrALAZINE (APRESOLINE) injection 5 mg (has no administration in time range)  acetaminophen (TYLENOL) tablet 650 mg (has no administration in time range)  zolpidem (AMBIEN) tablet 5 mg (has no administration in time range)  heparin injection 5,000 Units (has no administration in time range)  ipratropium-albuterol (DUONEB) 0.5-2.5 (3) MG/3ML nebulizer solution 3 mL (3 mLs Nebulization Given 01/14/18 0211)  methylPREDNISolone sodium succinate (SOLU-MEDROL) 125 mg/2 mL injection 125 mg (125 mg Intravenous Given 01/14/18 0211)  cefTRIAXone (ROCEPHIN) 1 g in sodium chloride 0.9 % 100  mL IVPB (1 g Intravenous New Bag/Given 01/14/18 0215)      Final Clinical Impressions(s) / ED Diagnoses   Final diagnoses:  COPD exacerbation (HCC)  Acute renal failure, unspecified acute renal failure type (HCC)  Iron deficiency anemia due to chronic blood loss  AKI (acute kidney injury) [facility](HCC)  Fall    Admit to medicine   Naysha Sholl, MD 01/14/18 [id number]

## 2018-01-15 ENCOUNTER — Inpatient Hospital Stay (HOSPITAL_COMMUNITY): Payer: Medicare PPO

## 2018-01-15 DIAGNOSIS — J9621 Acute and chronic respiratory failure with hypoxia: Secondary | ICD-10-CM

## 2018-01-15 LAB — COMPREHENSIVE METABOLIC PANEL
ALBUMIN: 2.6 g/dL — AB (ref 3.5–5.0)
ALT: 34 U/L (ref 0–44)
ANION GAP: 13 (ref 5–15)
AST: 49 U/L — ABNORMAL HIGH (ref 15–41)
Alkaline Phosphatase: 77 U/L (ref 38–126)
BUN: 71 mg/dL — ABNORMAL HIGH (ref 8–23)
CHLORIDE: 102 mmol/L (ref 98–111)
CO2: 17 mmol/L — AB (ref 22–32)
Calcium: 8.4 mg/dL — ABNORMAL LOW (ref 8.9–10.3)
Creatinine, Ser: 4.98 mg/dL — ABNORMAL HIGH (ref 0.44–1.00)
GFR calc non Af Amer: 7 mL/min — ABNORMAL LOW (ref >60)
GFR, EST AFRICAN AMERICAN: 8 mL/min — AB (ref >60)
GLUCOSE: 173 mg/dL — AB (ref 70–99)
Potassium: 3.9 mmol/L (ref 3.5–5.1)
SODIUM: 132 mmol/L — AB (ref 135–145)
Total Bilirubin: 0.4 mg/dL (ref 0.3–1.2)
Total Protein: 6.2 g/dL — ABNORMAL LOW (ref 6.5–8.1)

## 2018-01-15 LAB — URIC ACID: Uric Acid, Serum: 10.1 mg/dL — ABNORMAL HIGH (ref 2.5–7.1)

## 2018-01-15 LAB — KAPPA/LAMBDA LIGHT CHAINS
KAPPA, LAMDA LIGHT CHAIN RATIO: 2.32 — AB (ref 0.26–1.65)
Kappa free light chain: 87.1 mg/L — ABNORMAL HIGH (ref 3.3–19.4)
LAMDA FREE LIGHT CHAINS: 37.5 mg/L — AB (ref 5.7–26.3)

## 2018-01-15 LAB — CBC
HCT: 29 % — ABNORMAL LOW (ref 36.0–46.0)
HEMOGLOBIN: 9.3 g/dL — AB (ref 12.0–15.0)
MCH: 24.5 pg — AB (ref 26.0–34.0)
MCHC: 32.1 g/dL (ref 30.0–36.0)
MCV: 76.5 fL — ABNORMAL LOW (ref 78.0–100.0)
PLATELETS: 367 10*3/uL (ref 150–400)
RBC: 3.79 MIL/uL — AB (ref 3.87–5.11)
RDW: 15.9 % — ABNORMAL HIGH (ref 11.5–15.5)
WBC: 14.4 10*3/uL — ABNORMAL HIGH (ref 4.0–10.5)

## 2018-01-15 LAB — C3 COMPLEMENT: C3 COMPLEMENT: 149 mg/dL (ref 82–167)

## 2018-01-15 LAB — C4 COMPLEMENT: COMPLEMENT C4, BODY FLUID: 17 mg/dL (ref 14–44)

## 2018-01-15 LAB — HEPATITIS PANEL, ACUTE
HEP B S AG: NEGATIVE
Hep A IgM: NEGATIVE
Hep B C IgM: NEGATIVE

## 2018-01-15 LAB — UREA NITROGEN, URINE: UREA NITROGEN UR: 163 mg/dL

## 2018-01-15 LAB — ABO/RH: ABO/RH(D): A POS

## 2018-01-15 MED ORDER — LEVALBUTEROL HCL 0.63 MG/3ML IN NEBU
0.6300 mg | INHALATION_SOLUTION | Freq: Four times a day (QID) | RESPIRATORY_TRACT | Status: DC | PRN
  Administered 2018-01-15 – 2018-01-19 (×2): 0.63 mg via RESPIRATORY_TRACT
  Filled 2018-01-15 (×3): qty 3

## 2018-01-15 MED ORDER — ALBUTEROL SULFATE (2.5 MG/3ML) 0.083% IN NEBU: 2.5000 mg | INHALATION_SOLUTION | RESPIRATORY_TRACT | Status: DC | PRN

## 2018-01-15 NOTE — Progress Notes (Signed)
Pt first choice of [facility] not in contract with pts [employer] plan so pt would have copay starting day one- discussed alternative optiosn and pt would like Dana Pham- they will start Dana Graven[employer]ana auth this afternoon  CSW will continue to follow  Dana SisJenna H. Grisel Blumenstock, LCSW Clinical Social Worker [phone]

## 2018-01-15 NOTE — Clinical Social Work Note (Signed)
Clinical Social Work Assessment  Patient Details  Name: Dana BentonDorothy W Panetta MRN: [id number] Date of Birth: 1932-01-26  Date of referral:  01/15/18               Reason for consult:  Facility Placement                Permission sought to share information with:  [employer]cean[employer] Contact Representative Permission granted to share information::  Yes, Verbal Permission Granted  Name::     [employer]  Agency::  SNF  Relationship::  sister  Contact Information:     Housing/Transportation Living arrangements for the past 2 months:  Single Family Home Source of Information:  Patient, Siblings Patient Interpreter Needed:  None Criminal Activity/Legal Involvement Pertinent to Current Situation/Hospitalization:  No - Comment as needed Significant Relationships:  Siblings Lives with:  Siblings Do you feel safe going back to the place where you live?  No Need for family participation in patient care:  Yes (Comment)(receives some help at home)  Care giving concerns:  Pt lives at home with sister who assist pt some at home with any needs pt may have.  Pt normally pretty independent with mobility and ADLs but now with increased weakness pt elderly sisters do not think returning home would be manageable.   Social Worker assessment / plan:  CSW spoke with pt and pt two sisters regarding plan for time of DC.  CSW explained SNF and SNF referral process.    Pt and sisters believe that caring for pt at home might no longer be possible even following rehab.  CSW discussed LTC placement and informed pt and sisters of importance of applying for facility medicaid as soon as possible in order to give pt the best chance of transferring directly into LTC facility.  Pt and sisters cannot pay privately for SNF or ALF and SSI check is not sufficient to pay for placement.  CSW explained that rehab center might can assist with the process but they likely would be unable to take patient without a payor source and that pt might need to  return home temporarily while awaiting Medicaid approval.  Employment status:  Retired Database administratornsurance information:  Managed Medicare PT Recommendations:  Skilled Nursing Facility Information / Referral to community resources:  Skilled Nursing Facility  Patient/Family's Response to care:  Pt and sisters hopeful for SNF and that pt will be able to stay until LTC facility can be arranged.  Frustrated by lengthy process of getting Medicaid preventing patient from getting level of care needed.  Patient/Family's Understanding of and Emotional Response to Diagnosis, Current Treatment, and Prognosis:  Pt and family understand that pt would benefit from SNF stay to get help with mobility and medical issues- hopeful pt can get help finding permanent placement.  Emotional Assessment Appearance:  Appears stated age Attitude/Demeanor/Rapport:    Affect (typically observed):  Appropriate, Pleasant Orientation:  Oriented to Self, Oriented to Place, Oriented to  Time, Oriented to Situation Alcohol / Substance use:  Not Applicable Psych involvement (Current and /or in the community):  No (Comment)  Discharge Needs  Concerns to be addressed:  Care Coordination, Home Safety Concerns Readmission within the last 30 days:  No Current discharge risk:  Physical Impairment Barriers to Discharge:  Continued Medical Work up   Burna SisUris, Sharmarke Cicio H, LCSW 01/15/2018, 11:52 AM

## 2018-01-15 NOTE — Progress Notes (Addendum)
PROGRESS NOTE    Dana Pham  ZOX:[id number] DOB: 11/20/1931 DOA: 01/14/2018 PCP: Patient, No Pcp Per  Brief Narrative: 84 year old female with history of COPD, diastolic CHF, hypothyroidism, GERD presented to the emergency room with worsening dyspnea 1 week. She's been staying with her sister in [place] for the last 2-3 weeks, originally from [place]. -Patient also reports mild intermittent bright red blood per rectum, off and on for years couple episodes this past week. -Very poor historian, unfortunately no records in Epic   Assessment & Plan:   1. Acute on chronic respiratory failure -has COPD on 2L at baseline, but no wheezing noted -renal consulting, status post IV fluid bolus and saline infusion, appears more dyspneic this morning, will hold additional IV fluids, IV Lasix if develops distress, will check repeat chest x-ray -2-D echocardiogram with preserved EF and diastolic dysfunction -We have again requested records from [facility] in [place]  2. Acute kidney injury - called PCP's office and spoke to PA- her creatinine was normal at 0.9 in May 2019 -admitted with creatinine of 5, no significant proteinuria, renal ultrasound without hydronephrosis, Not on nephrotoxins at baseline and denies excessive NSAID use, complement level ok, SPEP pending -nephrology following, given IV fluid bolus followed by saline infusion yesterday -held IVF this am due to dyspnea, check CXR  3. Chronic intermittent hematochezia -Per report this is mild -History not suggestive of upper GI bleed, changed IV Protonix to po -Anemia panel suggestive of iron deficiency -If active bleeding ensues will request GI evaluation inpatient otherwise needs outpatient colonoscopy, according to her PCPs office she has declined colonoscopy and other GI workup in the past -Hb is 10, last baseline Hb was 11.4 in 11/2017  4. COPD/chronic resp failure on 3L Home O2 -no wheezes, continue  nebs  5. Hyponatremia -Could be due to mildly volume overloaded state -but got a bit better after IVF  6. Hypothyroidism -Continue Synthroid, follow-up TSH  DVT prophylaxis: SCDs Code Status: DNR Family Communication: no family at bedside, discussed with sister yesterday Disposition Plan: home with sister pending improvement in kidney function  Consultants:   Renal Dr.Colodonato   Procedures:   Antimicrobials:    Subjective: -feels more short of breath today  Objective: Vitals:   01/15/18 0426 01/15/18 0719 01/15/18 0720 01/15/18 0809  BP:    (!) 160/60  Pulse:    100  Resp:    (!) 25  Temp:    97.8 F (36.6 C)  TempSrc:    Oral  SpO2: 100% 96% 96% 98%  Weight:      Height:        Intake/Output Summary (Last 24 hours) at 01/15/2018 1317 Last data filed at 01/15/2018 0800 Gross per 24 hour  Intake 1666.82 ml  Output 800 ml  Net 866.82 ml   Filed Weights   01/14/18 0038 01/15/18 0102  Weight: 59.4 kg (131 lb) 62.5 kg (137 lb 12.6 oz)    Examination:  General exam: Appears calm and comfortable  Respiratory system: few basilar crackles Cardiovascular system: S1 & S2 heard, RRR Gastrointestinal system: Abdomen is nondistended, soft and nontender.Normal bowel sounds heard. Central nervous system: Alert and oriented. No focal neurological deficits. Extremities: trace edema Skin: No rashes, lesions or ulcers Psychiatry: Judgement and insight appear normal. Mood & affect appropriate.     Data Reviewed:   CBC: Recent Labs  Lab 01/14/18 0043 01/14/18 0113 01/14/18 0123 01/14/18 0635 01/14/18 1011 01/15/18 0232  WBC 14.6*  --   --  12.0* 11.4* 14.4*  NEUTROABS  --  12.0*  --   --   --   --   HGB 9.6*  --  10.5* 10.1* 10.4* 9.3*  HCT 30.1*  --  31.0* 31.8* 32.1* 29.0*  MCV 77.6*  --   --  76.4* 75.4* 76.5*  PLT 311  --   --  318 361 367   Basic Metabolic Panel: Recent Labs  Lab 01/14/18 0043 01/14/18 0123 01/15/18 0232  NA 129* 129* 132*  K  3.6 3.6 3.9  CL 97* 97* 102  CO2 18*  --  17*  GLUCOSE 106* 104* 173*  BUN 56* 56* 71*  CREATININE 4.72* 5.00* 4.98*  CALCIUM 8.0*  --  8.4*   GFR: Estimated Creatinine Clearance: 7.4 mL/min (A) (by C-G formula based on SCr of 4.98 mg/dL (H)). Liver Function Tests: Recent Labs  Lab 01/14/18 1834 01/15/18 0232  AST 52* 49*  ALT 32 34  ALKPHOS 78 77  BILITOT 0.6 0.4  PROT 5.8* 6.2*  ALBUMIN 2.4* 2.6*   No results for input(s): LIPASE, AMYLASE in the last 168 hours. No results for input(s): AMMONIA in the last 168 hours. Coagulation Profile: Recent Labs  Lab 01/14/18 0635  INR 1.17   Cardiac Enzymes: Recent Labs  Lab 01/14/18 0635  CKTOTAL 1,256*   BNP (last 3 results) No results for input(s): PROBNP in the last 8760 hours. HbA1C: Recent Labs    01/14/18 1058  HGBA1C 5.9*   CBG: No results for input(s): GLUCAP in the last 168 hours. Lipid Profile: No results for input(s): CHOL, HDL, LDLCALC, TRIG, CHOLHDL, LDLDIRECT in the last 72 hours. Thyroid Function Tests: Recent Labs    01/14/18 0200  TSH 0.032*   Anemia Panel: Recent Labs    01/14/18 0635  VITAMINB12 447  FOLATE 15.8  FERRITIN 192  TIBC 269  IRON 14*  RETICCTPCT 1.6   Urine analysis:    Component Value Date/Time   COLORURINE STRAW (A) 01/14/2018 0850   APPEARANCEUR CLEAR 01/14/2018 0850   LABSPEC 1.005 01/14/2018 0850   PHURINE 6.0 01/14/2018 0850   GLUCOSEU NEGATIVE 01/14/2018 0850   HGBUR LARGE (A) 01/14/2018 0850   BILIRUBINUR NEGATIVE 01/14/2018 0850   KETONESUR NEGATIVE 01/14/2018 0850   PROTEINUR 30 (A) 01/14/2018 0850   NITRITE NEGATIVE 01/14/2018 0850   LEUKOCYTESUR SMALL (A) 01/14/2018 0850   Sepsis Labs: @LABRCNTIP (procalcitonin:4,lacticidven:4)  ) Recent Results (from the past 240 hour(s))  Respiratory Panel by PCR     Status: None   Collection Time: 01/14/18  3:03 AM  Result Value Ref Range Status   Adenovirus NOT DETECTED NOT DETECTED Final   Coronavirus 229E  NOT DETECTED NOT DETECTED Final   Coronavirus HKU1 NOT DETECTED NOT DETECTED Final   Coronavirus NL63 NOT DETECTED NOT DETECTED Final   Coronavirus OC43 NOT DETECTED NOT DETECTED Final   Metapneumovirus NOT DETECTED NOT DETECTED Final   Rhinovirus / Enterovirus NOT DETECTED NOT DETECTED Final   Influenza A NOT DETECTED NOT DETECTED Final   Influenza B NOT DETECTED NOT DETECTED Final   Parainfluenza Virus 1 NOT DETECTED NOT DETECTED Final   Parainfluenza Virus 2 NOT DETECTED NOT DETECTED Final   Parainfluenza Virus 3 NOT DETECTED NOT DETECTED Final   Parainfluenza Virus 4 NOT DETECTED NOT DETECTED Final   Respiratory Syncytial Virus NOT DETECTED NOT DETECTED Final   Bordetella pertussis NOT DETECTED NOT DETECTED Final   Chlamydophila pneumoniae NOT DETECTED NOT DETECTED Final   Mycoplasma pneumoniae NOT DETECTED NOT  DETECTED Final    Comment: Performed at [facility], [address] [place], [place] [address]  Culture, blood (x 2)     Status: None (Preliminary result)   Collection Time: 01/14/18  6:32 AM  Result Value Ref Range Status   Specimen Description BLOOD RIGHT ANTECUBITAL  Final   Special Requests   Final    BOTTLES DRAWN AEROBIC ONLY Blood Culture adequate volume   Culture   Final    NO GROWTH 1 DAY Performed at [facility], [address], [place], [place] [address]    Report Status PENDING  Incomplete  Culture, blood (x 2)     Status: None (Preliminary result)   Collection Time: 01/14/18  6:37 AM  Result Value Ref Range Status   Specimen Description BLOOD RIGHT HAND  Final   Special Requests   Final    BOTTLES DRAWN AEROBIC ONLY Blood Culture adequate volume   Culture   Final    NO GROWTH 1 DAY Performed at [facility], [address], [place], [place] [address]    Report Status PENDING  Incomplete         Radiology Studies: Dg Chest 2 View  Result Date: 01/14/2018 CLINICAL DATA:  84 year old female with shortness of breath. EXAM: CHEST  - 2 VIEW COMPARISON:  Chest radiograph dated 01/21/2009 FINDINGS: There is emphysematous changes of the lungs with flattening of the diaphragms and increased AP diameter of the chest. There is no focal consolidation, pleural effusion, or pneumothorax. A 1.9 cm nodular appearing density in the right lower lung field likely corresponds to vascular structure seen on the lateral view. A pulmonary nodule is less likely but not entirely excluded. This can be further evaluated with chest CT. There is no focal consolidation, pleural effusion, or pneumothorax. Borderline cardiomegaly. Atherosclerotic calcification of the aorta. No acute osseous pathology. IMPRESSION: 1. No acute cardiopulmonary process.  Emphysema. 2. Nodular appearing density in the right lower lobe most likely artifact and related to pulmonary vasculature. CT may provide better evaluation. 3.  Aortic Atherosclerosis (ICD10-I70.0). Electronically Signed   By: Elgie Collard M.D.   On: 01/14/2018 01:32   Dg Tibia/fibula Left  Result Date: 01/14/2018 CLINICAL DATA:  Bilateral lower leg pain after falling EXAM: LEFT TIBIA AND FIBULA - 2 VIEW COMPARISON:  None. FINDINGS: There is no evidence of fracture or other focal bone lesions. Soft tissues are unremarkable. IMPRESSION: Negative. Electronically Signed   By: Deatra Robinson M.D.   On: 01/14/2018 04:35   Dg Tibia/fibula Right  Result Date: 01/14/2018 CLINICAL DATA:  84 year old female with bilateral lower extremity pain. EXAM: RIGHT TIBIA AND FIBULA - 2 VIEW COMPARISON:  Right lower extremity radiograph dated 01/14/2018 FINDINGS: There is no acute fracture or dislocation. The bones are osteopenic. Total right hip arthroplasty appears in anatomic alignment. There is mild diffuse subcutaneous edema. IMPRESSION: 1. No acute fracture or dislocation. 2. Osteopenia. 3. Mild diffuse subcutaneous edema. Electronically Signed   By: Elgie Collard M.D.   On: 01/14/2018 04:47   US Renal  Result Date:  01/14/2018 CLINICAL DATA:  84 year old female with acute renal insufficiency. EXAM: RENAL / URINARY TRACT ULTRASOUND COMPLETE COMPARISON:  None. FINDINGS: Right Kidney: Length: 10.3 cm. Normal echogenicity. No hydronephrosis or shadowing stone. Left Kidney: Length: 10.6 cm. There is a 3.0 x 3.2 x 3.2 cm upper pole cyst. No hydronephrosis or shadowing stone. Bladder: The urinary bladder is not visualized. IMPRESSION: No hydronephrosis or shadowing stone. Left renal  cyst. Electronically Signed   By: Elgie CollardArash  Radparvar M.D.   On: 01/14/2018 05:10        Scheduled Meds: . amLODipine  10 mg Oral Daily  . levalbuterol  1.25 mg Nebulization TID  . levothyroxine  25 mcg Oral QAC breakfast  . mometasone-formoterol  2 puff Inhalation BID  . pantoprazole  40 mg Oral Q1200   Continuous Infusions: . sodium chloride 0 mL/hr at 01/15/18 0834     LOS: 1 day    Time spent: 35min    Zannie CovePreetha Anyjah Roundtree, MD [facility] Page via [email or web address], password [email or web address] After 7PM please contact night-coverage  01/15/2018, 1:17 PM

## 2018-01-15 NOTE — Care Management Note (Signed)
Case Management Note  Patient Details  Name: Cena BentonDorothy W Clanton MRN: [id number] Date of Birth: 10-Dec-1931  Subjective/Objective:   From home , acute/chronic resp failure, on home oxygen 2 liters, pt eval rec SNF. CSW aware.                 Action/Plan: DC to SNF when medically ready.  Expected Discharge Date:  01/18/18               Expected Discharge Plan:  Skilled Nursing Facility  In-House Referral:  Clinical Social Work  Discharge planning Services  CM Consult  Post Acute Care Choice:    Choice offered to:     DME Arranged:    DME Agency:     HH Arranged:    HH Agency:     Status of Service:  In process, will continue to follow  If discussed at Long Length of Stay Meetings, dates discussed:    Additional Comments:  Leone Havenaylor, Foy Vanduyne Clinton, RN 01/15/2018, 11:50 AM

## 2018-01-15 NOTE — Progress Notes (Signed)
Occupational Therapy Evaluation Patient Details Name: Dana Pham MRN: [id number] DOB: [birth year] Today's Date: 01/15/2018    History of Present Illness Pt is an 84 y.o. female admitted 01/14/18 with worsening dyspnea. Worked up for acute on chronic respiratory failure suspect secondary to CHF; underlying COPD. Pt also with AKI and chronic intermittent hematochezia. PMH includes HTN, COPD (3L home O2).    Clinical Impression   PTA, pt was living at home with her sister, and was mod independent with functional ambulation at RW level and independent with ADLs and sister assisted with home management. Pt currently requires min A for functional mobility at RW level and min A for ADLs. Pt had increased wob and DoE 3/4 with light functional activity, SpO2 maintained >94% on 3lnc. Due to deficits listed below (see OT problem list), pt would benefit from acute OT to address establish goals to facilitate safe D/C to SNF. Pt verbalized desire to d/c to SNF for additional rehab with desire to possibly stay at SNF long-term for additional assistance with ADLs.    Follow Up Recommendations  SNF;Supervision/Assistance - 24 hour    Equipment Recommendations  Other (comment)(to be determined at d/c venue)    Recommendations for Other Services       Precautions / Restrictions Precautions Precautions: Fall Restrictions Weight Bearing Restrictions: No      Mobility Bed Mobility               General bed mobility comments: pt sitting EOB upon arrival  Transfers Overall transfer level: Needs assistance Equipment used: Rolling walker (2 wheeled) Transfers: Sit to/from Stand Sit to Stand: Min assist         General transfer comment: MinA for trunk elevation to stand from toilet; reliance on UE support using grab bar; VC to use RW for ambulation, pt initiated mobility with furniture walking    Balance Overall balance assessment: Needs assistance Sitting-balance support: No upper  extremity supported;Feet supported Sitting balance-Leahy Scale: Good     Standing balance support: During functional activity;No upper extremity supported Standing balance-Leahy Scale: Fair Standing balance comment: Can static stand and take a few steps with no UE support and min guard for balance;reliance on external UE support for ambulation and dynamic balance                           ADL either performed or assessed with clinical judgement   ADL Overall ADL's : Needs assistance/impaired Eating/Feeding: Set up   Grooming: Minimal assistance;Standing;Sitting   Upper Body Bathing: Set up;Sitting   Lower Body Bathing: Minimal assistance   Upper Body Dressing : Set up;Sitting   Lower Body Dressing: Minimal assistance;Sit to/from stand   Toilet Transfer: Minimal assistance;RW;Grab bars;Ambulation Toilet Transfer Details (indicate cue type and reason): ambulated to toilet at RW level;pt started BM during ambulation to toilet;required VC for safe hand placement Toileting- Clothing Manipulation and Hygiene: Minimal assistance;Sit to/from stand Toileting - Clothing Manipulation Details (indicate cue type and reason): minA for stability      Functional mobility during ADLs: Minimal assistance;Rolling walker General ADL Comments: pt quickly fatigued and wob quickly increased with minimal in room mobility;minA for stability with dynamic balance     Vision         Perception     Praxis      Pertinent Vitals/Pain Pain Assessment: No/denies pain     Hand Dominance Right   Extremity/Trunk Assessment Upper Extremity Assessment Upper Extremity Assessment: Generalized  weakness   Lower Extremity Assessment Lower Extremity Assessment: Defer to PT evaluation   Cervical / Trunk Assessment Cervical / Trunk Assessment: Kyphotic   Communication Communication Communication: HOH   Cognition Arousal/Alertness: Awake/alert Behavior During Therapy: WFL for tasks  assessed/performed Overall Cognitive Status: No family/caregiver present to determine baseline cognitive functioning Area of Impairment: Safety/judgement;Problem solving                         Safety/Judgement: Decreased awareness of safety(VC to use RW for ambulation;decreased awareness of O2 line )   Problem Solving: Requires verbal cues General Comments: pt verbalized need for additional assistance, wants to go to SNF, stated she may want to stay there longer for additional assistance;pt is impulsive, initiated ambulated with briefs around legs, required VC to pull them up    General Comments  pt's sister arrived in room toward end of session    Exercises     Shoulder Instructions      Home Living Family/patient expects to be discharged to:: Skilled nursing facility Living Arrangements: Other relatives(Sister) Available Help at Discharge: Family;Available 24 hours/day                         Home Equipment: Walker - 2 wheels;Walker - 4 wheels   Additional Comments: Wears 3L O2 at home      Prior Functioning/Environment Level of Independence: Needs assistance  Gait / Transfers Assistance Needed: Pt reports mod indep with rollator for short distances. Does not drive;furniture walks ADL's / Homemaking Assistance Needed: Reports mod indep with ADLs. Lives with sister who helps with cooking/household tasks   Comments: pt reports previous falls at home;reports increased wob with ambulation >9750ft        OT Problem List: Decreased strength;Decreased activity tolerance;Impaired balance (sitting and/or standing);Decreased cognition;Decreased safety awareness;Decreased knowledge of use of DME or AE;Cardiopulmonary status limiting activity      OT Treatment/Interventions: Self-care/ADL training;Therapeutic exercise;Energy conservation;DME and/or AE instruction;Therapeutic activities;Cognitive remediation/compensation;Patient/family education;Balance training     OT Goals(Current goals can be found in the care plan section) Acute Rehab OT Goals Patient Stated Goal: to go to SNF OT Goal Formulation: With patient Time For Goal Achievement: 01/29/18 Potential to Achieve Goals: Good ADL Goals Pt Will Perform Upper Body Dressing: with modified independence;sitting Pt Will Perform Lower Body Dressing: with modified independence;sit to/from stand Pt Will Transfer to Toilet: with modified independence;ambulating Additional ADL Goal #1: Pt will demonstrate understanding of 3 energy conservation techniques.  OT Frequency: Min 2X/week   Barriers to D/C:            Co-evaluation              AM-PAC PT "6 Clicks" Daily Activity     Outcome Measure Help from another person eating meals?: None Help from another person taking care of personal grooming?: A Little Help from another person toileting, which includes using toliet, bedpan, or urinal?: A Little Help from another person bathing (including washing, rinsing, drying)?: A Little Help from another person to put on and taking off regular upper body clothing?: A Little Help from another person to put on and taking off regular lower body clothing?: A Little 6 Click Score: 19   End of Session Equipment Utilized During Treatment: Gait belt;Rolling walker;Oxygen Nurse Communication: Mobility status  Activity Tolerance: Patient limited by fatigue Patient left: in chair;with call bell/phone within reach;with family/visitor present  OT Visit Diagnosis: Unsteadiness on feet (R26.81);Other  abnormalities of gait and mobility (R26.89);Muscle weakness (generalized) (M62.81);History of falling (Z91.81)                Time: 1610-9604 OT Time Calculation (min): 18 min Charges:    G-Codes:     Diona Browner OTS    Diona Browner 01/15/2018, 9:03 AM

## 2018-01-15 NOTE — Progress Notes (Signed)
S:No complaints.  "I feel fine" O:BP (!) 160/60 (BP Location: Right Arm)   Pulse 100   Temp 97.8 F (36.6 C) (Oral)   Resp (!) 25   Ht 5' 4"  (1.626 m)   Wt 62.5 kg (137 lb 12.6 oz)   SpO2 96%   BMI 23.65 kg/m   Intake/Output Summary (Last 24 hours) at 01/15/2018 1456 Last data filed at 01/15/2018 0800 Gross per 24 hour  Intake 1666.82 ml  Output 800 ml  Net 866.82 ml   Intake/Output: I/O last 3 completed shifts: In: 720.9 [P.O.:720; I.V.:0.9] Out: 1101 [Urine:1100; Stool:1]  Intake/Output this shift:  Total I/O In: 1426 [I.V.:1426] Out: -  Weight change: 3.079 kg (6 lb 12.6 oz) Gen: NAD CVS: no rub Resp: occ end-expiratory wheezes bilaterally Abd: +BS, soft, NT/ND Ext: no edema  Recent Labs  Lab 01/14/18 0043 01/14/18 0123 01/14/18 1834 01/15/18 0232  NA 129* 129*  --  132*  K 3.6 3.6  --  3.9  CL 97* 97*  --  102  CO2 18*  --   --  17*  GLUCOSE 106* 104*  --  173*  BUN 56* 56*  --  71*  CREATININE 4.72* 5.00*  --  4.98*  ALBUMIN  --   --  2.4* 2.6*  CALCIUM 8.0*  --   --  8.4*  AST  --   --  52* 49*  ALT  --   --  32 34   Liver Function Tests: Recent Labs  Lab 01/14/18 1834 01/15/18 0232  AST 52* 49*  ALT 32 34  ALKPHOS 78 77  BILITOT 0.6 0.4  PROT 5.8* 6.2*  ALBUMIN 2.4* 2.6*   No results for input(s): LIPASE, AMYLASE in the last 168 hours. No results for input(s): AMMONIA in the last 168 hours. CBC: Recent Labs  Lab 01/14/18 0043 01/14/18 0113  01/14/18 0635 01/14/18 1011 01/15/18 0232  WBC 14.6*  --   --  12.0* 11.4* 14.4*  NEUTROABS  --  12.0*  --   --   --   --   HGB 9.6*  --    < > 10.1* 10.4* 9.3*  HCT 30.1*  --    < > 31.8* 32.1* 29.0*  MCV 77.6*  --   --  76.4* 75.4* 76.5*  PLT 311  --   --  318 361 367   < > = values in this interval not displayed.   Cardiac Enzymes: Recent Labs  Lab 01/14/18 0635  CKTOTAL 1,256*   CBG: No results for input(s): GLUCAP in the last 168 hours.  Iron Studies:  Recent Labs     01/14/18 0635  IRON 14*  TIBC 269  FERRITIN 192   Studies/Results: Dg Chest 2 View  Result Date: 01/15/2018 CLINICAL DATA:  Chronic dyspnea, productive cough. EXAM: CHEST - 2 VIEW COMPARISON:  Radiographs of January 14, 2018. FINDINGS: Stable cardiomediastinal silhouette. Central pulmonary vascular congestion is noted. Atherosclerosis of thoracic aorta is noted. No pneumothorax is noted. Minimal bilateral pleural effusions are noted. Possible bibasilar subsegmental atelectasis or edema is noted. Bony thorax is unremarkable. IMPRESSION: Central pulmonary vascular congestion is noted with possible mild bibasilar subsegmental atelectasis or edema. Minimal pleural effusions are noted. Aortic Atherosclerosis (ICD10-I70.0). Electronically Signed   By: Marijo Conception, M.D.   On: 01/15/2018 14:18   Dg Chest 2 View  Result Date: 01/14/2018 CLINICAL DATA:  84 year old female with shortness of breath. EXAM: CHEST - 2 VIEW COMPARISON:  Chest radiograph  dated 01/21/2009 FINDINGS: There is emphysematous changes of the lungs with flattening of the diaphragms and increased AP diameter of the chest. There is no focal consolidation, pleural effusion, or pneumothorax. A 1.9 cm nodular appearing density in the right lower lung field likely corresponds to vascular structure seen on the lateral view. A pulmonary nodule is less likely but not entirely excluded. This can be further evaluated with chest CT. There is no focal consolidation, pleural effusion, or pneumothorax. Borderline cardiomegaly. Atherosclerotic calcification of the aorta. No acute osseous pathology. IMPRESSION: 1. No acute cardiopulmonary process.  Emphysema. 2. Nodular appearing density in the right lower lobe most likely artifact and related to pulmonary vasculature. CT may provide better evaluation. 3.  Aortic Atherosclerosis (ICD10-I70.0). Electronically Signed   By: Anner Crete M.D.   On: 01/14/2018 01:32   Dg Tibia/fibula Left  Result Date:  01/14/2018 CLINICAL DATA:  Bilateral lower leg pain after falling EXAM: LEFT TIBIA AND FIBULA - 2 VIEW COMPARISON:  None. FINDINGS: There is no evidence of fracture or other focal bone lesions. Soft tissues are unremarkable. IMPRESSION: Negative. Electronically Signed   By: Ulyses Jarred M.D.   On: 01/14/2018 04:35   Dg Tibia/fibula Right  Result Date: 01/14/2018 CLINICAL DATA:  84 year old female with bilateral lower extremity pain. EXAM: RIGHT TIBIA AND FIBULA - 2 VIEW COMPARISON:  Right lower extremity radiograph dated 01/14/2018 FINDINGS: There is no acute fracture or dislocation. The bones are osteopenic. Total right hip arthroplasty appears in anatomic alignment. There is mild diffuse subcutaneous edema. IMPRESSION: 1. No acute fracture or dislocation. 2. Osteopenia. 3. Mild diffuse subcutaneous edema. Electronically Signed   By: Anner Crete M.D.   On: 01/14/2018 04:47   US Renal  Result Date: 01/14/2018 CLINICAL DATA:  84 year old female with acute renal insufficiency. EXAM: RENAL / URINARY TRACT ULTRASOUND COMPLETE COMPARISON:  None. FINDINGS: Right Kidney: Length: 10.3 cm. Normal echogenicity. No hydronephrosis or shadowing stone. Left Kidney: Length: 10.6 cm. There is a 3.0 x 3.2 x 3.2 cm upper pole cyst. No hydronephrosis or shadowing stone. Bladder: The urinary bladder is not visualized. IMPRESSION: No hydronephrosis or shadowing stone. Left renal cyst. Electronically Signed   By: Anner Crete M.D.   On: 01/14/2018 05:10   . amLODipine  10 mg Oral Daily  . levalbuterol  1.25 mg Nebulization TID  . levothyroxine  25 mcg Oral QAC breakfast  . mometasone-formoterol  2 puff Inhalation BID  . pantoprazole  40 mg Oral Q1200    BMET    Component Value Date/Time   NA 132 (L) 01/15/2018 0232   K 3.9 01/15/2018 0232   CL 102 01/15/2018 0232   CO2 17 (L) 01/15/2018 0232   GLUCOSE 173 (H) 01/15/2018 0232   BUN 71 (H) 01/15/2018 0232   CREATININE 4.98 (H) 01/15/2018 0232   CALCIUM  8.4 (L) 01/15/2018 0232   GFRNONAA 7 (L) 01/15/2018 0232   GFRAA 8 (L) 01/15/2018 0232   CBC    Component Value Date/Time   WBC 14.4 (H) 01/15/2018 0232   RBC 3.79 (L) 01/15/2018 0232   HGB 9.3 (L) 01/15/2018 0232   HCT 29.0 (L) 01/15/2018 0232   PLT 367 01/15/2018 0232   MCV 76.5 (L) 01/15/2018 0232   MCH 24.5 (L) 01/15/2018 0232   MCHC 32.1 01/15/2018 0232   RDW 15.9 (H) 01/15/2018 0232   LYMPHSABS 1.2 01/14/2018 0113   MONOABS 0.9 01/14/2018 0113   EOSABS 0.1 01/14/2018 0113   BASOSABS 0.0 01/14/2018 0113  Assessment/Plan:  1. AKI- non-oliguric and a significant change from her baseline Cr of 0.93 on 12/14/17.  She did note gross hematuria 2-3 days prior to admission but reports that it has cleared up.  She does have a history of nephrolithiasis, however [place] was without evidence of obstruction or stones.  Serologies pending but we did discuss the likely need for a renal biopsy as we do not have any etiology at this time.  She denies any N/V/D, dysuria, pyuria, retention or urgency.  No NSAIDs or recent antibiotics. 1. Normal C3 and C4, elevated CK at 1256, ESR elevated at 40, negative hepatitis panel, SPEP and UPEP pending as well as ANCA.  Will send off urine eosinophils as she is on a PPI.  Possible biopsy this Wednesday. 2. Acute on chronic respiratory distress- pt with history of COPD and IVF's stopped this morning due to some SOB.   3. Anemia- pt with history of hematochezia.  Hgb was 11 in May, GI eval per primary 4. COPD- on home O2 5. Hyponatremia- due to #1 and #2, improved after IVF's.  6. Hypothyroidism- on replacement and TSH low at 0.032 per primary 7. Disposition- will likely need SNF placement due to deconditioning  Donetta Potts, MD [facility] [phone]

## 2018-01-15 NOTE — NC FL2 (Signed)
[place] MEDICAID FL2 LEVEL OF CARE SCREENING TOOL     IDENTIFICATION  Patient Name: CARSON MECHE Birthdate: Jun 01, 1932 Sex: female Admission Date (Current Location): 01/14/2018  [facility] and [place] Number:  Producer, television/film/video and Address:  [facility], [address] [place], [place] [address]      Provider Number: [id number]  Attending Physician Name and Address:  Zannie Cove, MD  Relative Name and Phone Number:       Current Level of Care: Hospital Recommended Level of Care: Skilled Nursing Facility Prior Approval Number:    Date Approved/Denied:   PASRR Number:    Discharge Plan: SNF    Current Diagnoses: Patient Active Problem List   Diagnosis Date Noted  . Hyponatremia 01/14/2018  . AKI (acute kidney injury) (HCC) 01/14/2018  . Microcytic anemia 01/14/2018  . Positive fecal occult blood test 01/14/2018  . Bilateral leg pain 01/14/2018  . Acute on chronic respiratory failure with hypoxia (HCC) 01/14/2018  . GIB (gastrointestinal bleeding) 01/14/2018  . Fall 01/14/2018  . COPD exacerbation (HCC) 01/14/2018  . Essential hypertension   . GERD (gastroesophageal reflux disease)   . Hypothyroidism     Orientation RESPIRATION BLADDER Height & Weight     Self, Time, Situation, Place  O2(2L [place]) Continent Weight: 137 lb 12.6 oz (62.5 kg) Height:  5\' 4"  (162.6 cm)  BEHAVIORAL SYMPTOMS/MOOD NEUROLOGICAL BOWEL NUTRITION STATUS      Continent Diet(cardiac; renal; fluid restriction)  AMBULATORY STATUS COMMUNICATION OF NEEDS Skin   Limited Assist Verbally Normal                       Personal Care Assistance Level of Assistance  Bathing, Dressing Bathing Assistance: Limited assistance   Dressing Assistance: Limited assistance     Functional Limitations Info             SPECIAL CARE FACTORS FREQUENCY  PT (By licensed PT), OT (By licensed OT)     PT Frequency: 5/wk OT Frequency: 5/wk            Contractures       Additional Factors Info  Code Status, Allergies Code Status Info: DNR Allergies Info: latex           Current Medications (01/15/2018):  This is the current hospital active medication list Current Facility-Administered Medications  Medication Dose Route Frequency Provider Last Rate Last Dose  . 0.9 %  sodium chloride infusion   Intravenous Continuous Zannie Cove, MD 0 mL/hr at 01/15/18 [phone]    . acetaminophen (TYLENOL) tablet 650 mg  650 mg Oral Q6H PRN Lorretta Harp, MD      . amLODipine (NORVASC) tablet 10 mg  10 mg Oral Daily Lorretta Harp, MD   10 mg at 01/14/18 0817  . dextromethorphan-guaiFENesin (MUCINEX DM) 30-600 MG per 12 hr tablet 1 tablet  1 tablet Oral BID PRN Lorretta Harp, MD      . hydrALAZINE (APRESOLINE) injection 5 mg  5 mg Intravenous Q2H PRN Lorretta Harp, MD      . levalbuterol Pauline Aus) nebulizer solution 0.63 mg  0.63 mg Nebulization Q6H PRN Leda Gauze, NP   0.63 mg at 01/15/18 0426  . levalbuterol (XOPENEX) nebulizer solution 1.25 mg  1.25 mg Nebulization TID Zannie Cove, MD   1.25 mg at 01/15/18 0717  . levothyroxine (SYNTHROID, LEVOTHROID) tablet 25 mcg  25 mcg Oral QAC breakfast Lorretta Harp, MD   25 mcg at 01/15/18 0754  .  mometasone-formoterol (DULERA) 200-5 MCG/ACT inhaler 2 puff  2 puff Inhalation BID Lorretta HarpNiu, Xilin, MD   2 puff at 01/15/18 0719  . ondansetron (ZOFRAN) injection 4 mg  4 mg Intravenous Q8H PRN Lorretta HarpNiu, Xilin, MD      . pantoprazole (PROTONIX) EC tablet 40 mg  40 mg Oral Q1200 Zannie CoveJoseph, Preetha, MD      . zolpidem [facility](AMBIEN) tablet 5 mg  5 mg Oral QHS PRN Lorretta HarpNiu, Xilin, MD   5 mg at 01/14/18 2243     Discharge Medications: Please see discharge summary for a list of discharge medications.  Relevant Imaging Results:  Relevant Lab Results:   Additional Information SS#: [id number]  Burna SisUris, [place] H, LCSW

## 2018-01-15 NOTE — Progress Notes (Signed)
*  PRELIMINARY RESULTS* Vascular Ultrasound Bilateral lower extremity venous duplex has been completed.  Preliminary findings: No evidence of deep vein thrombosis or baker's cysts bilaterally.   Dana FischerCharlotte C Malena Pham 01/15/2018, 12:07 PM

## 2018-01-16 ENCOUNTER — Inpatient Hospital Stay (HOSPITAL_COMMUNITY): Payer: Medicare PPO

## 2018-01-16 LAB — PROTEIN ELECTROPHORESIS, SERUM
A/G Ratio: 0.8 (ref 0.7–1.7)
ALBUMIN ELP: 2.5 g/dL — AB (ref 2.9–4.4)
ALPHA-1-GLOBULIN: 0.6 g/dL — AB (ref 0.0–0.4)
Alpha-2-Globulin: 0.9 g/dL (ref 0.4–1.0)
Beta Globulin: 0.8 g/dL (ref 0.7–1.3)
GLOBULIN, TOTAL: 3.3 g/dL (ref 2.2–3.9)
Gamma Globulin: 0.9 g/dL (ref 0.4–1.8)
TOTAL PROTEIN ELP: 5.8 g/dL — AB (ref 6.0–8.5)

## 2018-01-16 LAB — CBC
HEMATOCRIT: 27.5 % — AB (ref 36.0–46.0)
Hemoglobin: 8.7 g/dL — ABNORMAL LOW (ref 12.0–15.0)
MCH: 24.5 pg — AB (ref 26.0–34.0)
MCHC: 31.6 g/dL (ref 30.0–36.0)
MCV: 77.5 fL — AB (ref 78.0–100.0)
PLATELETS: 387 10*3/uL (ref 150–400)
RBC: 3.55 MIL/uL — ABNORMAL LOW (ref 3.87–5.11)
RDW: 16.4 % — AB (ref 11.5–15.5)
WBC: 15.3 10*3/uL — ABNORMAL HIGH (ref 4.0–10.5)

## 2018-01-16 LAB — COMPREHENSIVE METABOLIC PANEL
ALT: 40 U/L (ref 0–44)
AST: 48 U/L — ABNORMAL HIGH (ref 15–41)
Albumin: 2.7 g/dL — ABNORMAL LOW (ref 3.5–5.0)
Alkaline Phosphatase: 72 U/L (ref 38–126)
Anion gap: 12 (ref 5–15)
BILIRUBIN TOTAL: 0.6 mg/dL (ref 0.3–1.2)
BUN: 92 mg/dL — ABNORMAL HIGH (ref 8–23)
CHLORIDE: 104 mmol/L (ref 98–111)
CO2: 18 mmol/L — ABNORMAL LOW (ref 22–32)
CREATININE: 4.95 mg/dL — AB (ref 0.44–1.00)
Calcium: 8.3 mg/dL — ABNORMAL LOW (ref 8.9–10.3)
GFR, EST AFRICAN AMERICAN: 8 mL/min — AB (ref >60)
GFR, EST NON AFRICAN AMERICAN: 7 mL/min — AB (ref >60)
Glucose, Bld: 129 mg/dL — ABNORMAL HIGH (ref 70–99)
Potassium: 4.1 mmol/L (ref 3.5–5.1)
Sodium: 134 mmol/L — ABNORMAL LOW (ref 135–145)
TOTAL PROTEIN: 5.7 g/dL — AB (ref 6.5–8.1)

## 2018-01-16 LAB — ANTINUCLEAR ANTIBODIES, IFA: ANA Ab, IFA: NEGATIVE

## 2018-01-16 LAB — ANTISTREPTOLYSIN O TITER: ASO: 58 IU/mL (ref 0.0–200.0)

## 2018-01-16 LAB — CK: CK TOTAL: 442 U/L — AB (ref 38–234)

## 2018-01-16 LAB — MPO/PR-3 (ANCA) ANTIBODIES

## 2018-01-16 LAB — ANTI-DNA ANTIBODY, DOUBLE-STRANDED: ds DNA Ab: <1 IU/mL (ref 0–9)

## 2018-01-16 LAB — PHOSPHORUS: PHOSPHORUS: 5.6 mg/dL — AB (ref 2.5–4.6)

## 2018-01-16 LAB — IMMUNOFIXATION, URINE

## 2018-01-16 LAB — TSH: TSH: 0.017 u[IU]/mL — ABNORMAL LOW (ref 0.350–4.500)

## 2018-01-16 MED ORDER — DEXTROSE 5 % IV SOLN
120.0000 mg | Freq: Once | INTRAVENOUS | Status: AC
  Administered 2018-01-16: 120 mg via INTRAVENOUS
  Filled 2018-01-16: qty 10

## 2018-01-16 MED ORDER — METOPROLOL TARTRATE 5 MG/5ML IV SOLN
5.0000 mg | Freq: Once | INTRAVENOUS | Status: AC
  Administered 2018-01-16: 5 mg via INTRAVENOUS
  Filled 2018-01-16: qty 5

## 2018-01-16 MED ORDER — FUROSEMIDE 10 MG/ML IJ SOLN
60.0000 mg | Freq: Once | INTRAMUSCULAR | Status: AC
  Administered 2018-01-16: 60 mg via INTRAVENOUS
  Filled 2018-01-16: qty 6

## 2018-01-16 MED ORDER — MORPHINE SULFATE (PF) 2 MG/ML IV SOLN
1.0000 mg | Freq: Once | INTRAVENOUS | Status: AC
  Administered 2018-01-16: 1 mg via INTRAVENOUS
  Filled 2018-01-16: qty 1

## 2018-01-16 MED ORDER — FUROSEMIDE 10 MG/ML IJ SOLN
80.0000 mg | Freq: Once | INTRAMUSCULAR | Status: AC
  Administered 2018-01-16: 80 mg via INTRAVENOUS
  Filled 2018-01-16: qty 8

## 2018-01-16 NOTE — Progress Notes (Signed)
Shift event: Pt with tachypnea, increased WOB, crackles, and slightly elevated BP and HR. CXR, Metoprolol, and Bipap placed. Report to attending.  KJKG, NP [facility]

## 2018-01-16 NOTE — Progress Notes (Signed)
Called into patient's room for worsening dyspnea. HR in 130s and RR between 35-40. O2 sats remained stable. Pt. Placed back on BiPAP. Pt experiencing some moderate to severe anxiety. Dr. Jomarie LongsJoseph notified. Morphine and Lasix ordered and given. Will continue to monitor.

## 2018-01-16 NOTE — Progress Notes (Signed)
PROGRESS NOTE    Dana Pham  ZOX:[id number] DOB: 10/27/1931 DOA: 01/14/2018 PCP: Patient, No Pcp Per  Brief Narrative: 84 year old female with history of COPD, diastolic CHF, hypothyroidism, GERD presented to the emergency room with worsening dyspnea 1 week. She's been staying with her sister in [place] for the last 2-3 weeks, originally from [place]. -Patient also reports mild intermittent bright red blood per rectum, off and on for years couple episodes this past week. -on admission creatinine was 5, hemoglobin 10.4, chest x-ray without acute findings, no significant edema on exam. -Very poor historian, unfortunately no records in Epic -nephrology consulted, given IV fluids, subsequently developed volume overload/respiratory distress, requiring BiPAP and diuretics -IR consulted for renal biopsy   Assessment & Plan:  1. Acute on chronic respiratory failure -due to volume overload from acute renal failure, diastolic CHF -BiPAP, IV Lasix 2 doses, IV fluids discontinued yesterday a.m. -Echocardiogram with preserved EF and diastolic dysfunction -do not have any records from [facility] yet -wean off BiPAP this afternoon as tolerated  2. Acute kidney injury - I called PCP's office and spoke to PA- her creatinine was normal at 0.9 in May 2019 -admitted with creatinine of 5, no significant proteinuria, renal ultrasound without hydronephrosis, Not on nephrotoxins at baseline and denies excessive NSAID use, complement level ok, SPEP : polyclonal gammopathy-nonspecific -nephrology following, s/p IVF 6/30, CXR with fluid overload, Lasix x2 doses -plan for renal biopsy tomorrow by IR  3. Chronic intermittent hematochezia -Per report this is mild -History not suggestive of upper GI bleed, changed IV Protonix to po -Anemia panel suggestive of iron deficiency -no active bleeding noted inpatient, recommended outpatient GI evaluation for colonoscopy, per PCPs office  she has declined colonoscopy in the past. -Hb is 10, last baseline Hb was 11.4 in 11/2017 -hemoglobin down due to hemodilution, expect this to improve his diuresis  4. COPD/chronic resp failure on 3L Home O2 -wheezing currently due to volume overload, improving with diuresis -nebs PRN  5. Hyponatremia -Could be due to mildly volume overloaded state -improving  6. Hypothyroidism -Continue Synthroid, follow-up TSH  DVT prophylaxis: SCDs Code Status: DNR Family Communication: no family at bedside, discussed with sister yesterday Disposition Plan: home with sister pending improvement in kidney function  Consultants:   Renal Dr.Colodonato   Procedures:   Antimicrobials:    Subjective: -developed respiratory distress early this morning, stat chest x-ray and noted worsening pulmonary edema, started on BiPAP, given Lasix IV 1  Objective: Vitals:   01/16/18 0635 01/16/18 0655 01/16/18 0803 01/16/18 0830  BP: (!) 157/71 130/83 137/64   Pulse: (!) 108 69 81   Resp: (!) 24 (!) 34 (!) 22   Temp:   97.6 F (36.4 C)   TempSrc:   Oral   SpO2:   99% 96%  Weight:      Height:        Intake/Output Summary (Last 24 hours) at 01/16/2018 1236 Last data filed at 01/16/2018 0800 Gross per 24 hour  Intake 240 ml  Output 600 ml  Net -360 ml   Filed Weights   01/14/18 0038 01/15/18 0102 01/16/18 0500  Weight: 59.4 kg (131 lb) 62.5 kg (137 lb 12.6 oz) 66.5 kg (146 lb 9.7 oz)    Examination:  Gen: Awake, Alert, Oriented X 2, on BiPAP in mild respiratory distress HEENT: PERRLA, Neck supple, + JVD Lungs: bilateral crackles, decreased breath sounds at both bases CVS: S1-S2 regular rhythm, tachycardic Abd: soft, Non tender, non  distended, BS present Extremities: trace edema Skin: no new rashes Psychiatry: Judgement and insight appear normal. Mood & affect appropriate.     Data Reviewed:   CBC: Recent Labs  Lab 01/14/18 0043 01/14/18 0113 01/14/18 0123 01/14/18 [id number]  01/14/18 1011 01/15/18 0232 01/16/18 0248  WBC 14.6*  --   --  12.0* 11.4* 14.4* 15.3*  NEUTROABS  --  12.0*  --   --   --   --   --   HGB 9.6*  --  10.5* 10.1* 10.4* 9.3* 8.7*  HCT 30.1*  --  31.0* 31.8* 32.1* 29.0* 27.5*  MCV 77.6*  --   --  76.4* 75.4* 76.5* 77.5*  PLT 311  --   --  318 361 367 387   Basic Metabolic Panel: Recent Labs  Lab 01/14/18 0043 01/14/18 0123 01/15/18 0232 01/16/18 0248  NA 129* 129* 132* 134*  K 3.6 3.6 3.9 4.1  CL 97* 97* 102 104  CO2 18*  --  17* 18*  GLUCOSE 106* 104* 173* 129*  BUN 56* 56* 71* 92*  CREATININE 4.72* 5.00* 4.98* 4.95*  CALCIUM 8.0*  --  8.4* 8.3*  PHOS  --   --   --  5.6*   GFR: Estimated Creatinine Clearance: 8.1 mL/min (A) (by C-G formula based on SCr of 4.95 mg/dL (H)). Liver Function Tests: Recent Labs  Lab 01/14/18 1834 01/15/18 0232 01/16/18 0248  AST 52* 49* 48*  ALT 32 34 40  ALKPHOS 78 77 72  BILITOT 0.6 0.4 0.6  PROT 5.8* 6.2* 5.7*  ALBUMIN 2.4* 2.6* 2.7*   No results for input(s): LIPASE, AMYLASE in the last 168 hours. No results for input(s): AMMONIA in the last 168 hours. Coagulation Profile: Recent Labs  Lab 01/14/18 0635  INR 1.17   Cardiac Enzymes: Recent Labs  Lab 01/14/18 0635 01/16/18 0248  CKTOTAL 1,256* 442*   BNP (last 3 results) No results for input(s): PROBNP in the last 8760 hours. HbA1C: Recent Labs    01/14/18 1058  HGBA1C 5.9*   CBG: No results for input(s): GLUCAP in the last 168 hours. Lipid Profile: No results for input(s): CHOL, HDL, LDLCALC, TRIG, CHOLHDL, LDLDIRECT in the last 72 hours. Thyroid Function Tests: Recent Labs    01/14/18 0200  TSH 0.032*   Anemia Panel: Recent Labs    01/14/18 0635  VITAMINB12 447  FOLATE 15.8  FERRITIN 192  TIBC 269  IRON 14*  RETICCTPCT 1.6   Urine analysis:    Component Value Date/Time   COLORURINE STRAW (A) 01/14/2018 0850   APPEARANCEUR CLEAR 01/14/2018 0850   LABSPEC 1.005 01/14/2018 0850   PHURINE 6.0  01/14/2018 0850   GLUCOSEU NEGATIVE 01/14/2018 0850   HGBUR LARGE (A) 01/14/2018 0850   BILIRUBINUR NEGATIVE 01/14/2018 0850   KETONESUR NEGATIVE 01/14/2018 0850   PROTEINUR 30 (A) 01/14/2018 0850   NITRITE NEGATIVE 01/14/2018 0850   LEUKOCYTESUR SMALL (A) 01/14/2018 0850   Sepsis Labs: @LABRCNTIP (procalcitonin:4,lacticidven:4)  ) Recent Results (from the past 240 hour(s))  Respiratory Panel by PCR     Status: None   Collection Time: 01/14/18  3:03 AM  Result Value Ref Range Status   Adenovirus NOT DETECTED NOT DETECTED Final   Coronavirus 229E NOT DETECTED NOT DETECTED Final   Coronavirus HKU1 NOT DETECTED NOT DETECTED Final   Coronavirus NL63 NOT DETECTED NOT DETECTED Final   Coronavirus OC43 NOT DETECTED NOT DETECTED Final   Metapneumovirus NOT DETECTED NOT DETECTED Final   Rhinovirus / Enterovirus NOT  DETECTED NOT DETECTED Final   Influenza A NOT DETECTED NOT DETECTED Final   Influenza B NOT DETECTED NOT DETECTED Final   Parainfluenza Virus 1 NOT DETECTED NOT DETECTED Final   Parainfluenza Virus 2 NOT DETECTED NOT DETECTED Final   Parainfluenza Virus 3 NOT DETECTED NOT DETECTED Final   Parainfluenza Virus 4 NOT DETECTED NOT DETECTED Final   Respiratory Syncytial Virus NOT DETECTED NOT DETECTED Final   Bordetella pertussis NOT DETECTED NOT DETECTED Final   Chlamydophila pneumoniae NOT DETECTED NOT DETECTED Final   Mycoplasma pneumoniae NOT DETECTED NOT DETECTED Final    Comment: Performed at [facility], [address], [place], [place] [address]  Culture, blood (x 2)     Status: None (Preliminary result)   Collection Time: 01/14/18  6:32 AM  Result Value Ref Range Status   Specimen Description BLOOD RIGHT ANTECUBITAL  Final   Special Requests   Final    BOTTLES DRAWN AEROBIC ONLY Blood Culture adequate volume   Culture   Final    NO GROWTH 2 DAYS Performed at [facility], [address], [place], [place] [address]    Report Status PENDING  Incomplete   Culture, blood (x 2)     Status: None (Preliminary result)   Collection Time: 01/14/18  6:37 AM  Result Value Ref Range Status   Specimen Description BLOOD RIGHT HAND  Final   Special Requests   Final    BOTTLES DRAWN AEROBIC ONLY Blood Culture adequate volume   Culture   Final    NO GROWTH 2 DAYS Performed at [facility], [address], [place], [place] [address]    Report Status PENDING  Incomplete         Radiology Studies: Dg Chest 2 View  Result Date: 01/15/2018 CLINICAL DATA:  Chronic dyspnea, productive cough. EXAM: CHEST - 2 VIEW COMPARISON:  Radiographs of January 14, 2018. FINDINGS: Stable cardiomediastinal silhouette. Central pulmonary vascular congestion is noted. Atherosclerosis of thoracic aorta is noted. No pneumothorax is noted. Minimal bilateral pleural effusions are noted. Possible bibasilar subsegmental atelectasis or edema is noted. Bony thorax is unremarkable. IMPRESSION: Central pulmonary vascular congestion is noted with possible mild bibasilar subsegmental atelectasis or edema. Minimal pleural effusions are noted. Aortic Atherosclerosis (ICD10-I70.0). Electronically Signed   By: Lupita Raider, M.D.   On: 01/15/2018 14:18   Dg Chest Port 1 View  Result Date: 01/16/2018 CLINICAL DATA:  Shortness of breath.  Cough. EXAM: PORTABLE CHEST 1 VIEW COMPARISON:  01/15/2018. FINDINGS: Cardiomegaly with pulmonary vascular prominence bilateral interstitial prominence and small bilateral pleural effusions. Findings have progressed from prior exam. No pneumothorax. Degenerative change thoracic spine. IMPRESSION: Findings consistent with congestive heart failure bilateral from interstitial edema bilateral pleural effusions. Findings have progressed from prior exam. Electronically Signed   By: Maisie Fus  Register   On: 01/16/2018 07:03        Scheduled Meds: . amLODipine  10 mg Oral Daily  . levalbuterol  1.25 mg Nebulization TID  . levothyroxine  25 mcg Oral QAC  breakfast  . mometasone-formoterol  2 puff Inhalation BID  . pantoprazole  40 mg Oral Q1200   Continuous Infusions:    LOS: 2 days    Time spent:    Zannie Cove, MD [facility] Page via [email or web address], password [email or web address] After 7PM please contact night-coverage  01/16/2018, 12:36 PM

## 2018-01-16 NOTE — Consult Note (Signed)
Chief Complaint: Patient was seen in consultation today for random renal biopsy Chief Complaint  Patient presents with  . Shortness of Breath   at the request of Dr Arrie Aran  Supervising Physician: Oley Balm  Patient Status: [facility] - In-pt  History of Present Illness: Dana Pham is a 84 y.o. female   Hx HTN; COPD Worsening SOB x 1 week Edema B legs Gross hematuria Creatinine of 5 Acute renal failure Request now for random renal biopsy per Dr Arrie Aran    Past Medical History:  Diagnosis Date  . COPD (chronic obstructive pulmonary disease) (HCC)   . Essential hypertension   . GERD (gastroesophageal reflux disease)   . Hypothyroidism     Past Surgical History:  Procedure Laterality Date  . Bilateral hip replacement     Patient states that I had my gallbladder removed  . CHOLECYSTECTOMY    . Right knee replacement      Allergies: Latex  Medications: Prior to Admission medications   Medication Sig Start Date End Date Taking? Authorizing Provider  albuterol (VENTOLIN HFA) 108 (90 Base) MCG/ACT inhaler Inhale 2 puffs into the lungs every 4 (four) hours as needed for wheezing or shortness of breath.   Yes [provider]  amLODipine (NORVASC) 10 MG tablet Take 10 mg by mouth daily.   Yes [provider]  aspirin EC 81 MG tablet Take 81 mg by mouth daily.   Yes [provider]  budesonide-formoterol (SYMBICORT) 160-4.5 MCG/ACT inhaler Inhale 2 puffs into the lungs 2 (two) times daily.   Yes [provider]  furosemide (LASIX) 20 MG tablet Take 20 mg by mouth daily.   Yes [provider]  levothyroxine (SYNTHROID, LEVOTHROID) 25 MCG tablet Take 25 mcg by mouth daily before breakfast.   Yes [provider]  pantoprazole (PROTONIX) 40 MG tablet Take 40 mg by mouth daily before breakfast.   Yes [provider]  Tiotropium Bromide Monohydrate (SPIRIVA RESPIMAT) 2.5 MCG/ACT AERS Inhale 2 puffs into  the lungs daily.   Yes [provider]     Family History  Problem Relation Age of Onset  . Dementia Father   . Hypertension Sister   . Diabetes Mellitus I Sister   . Heart disease Sister     Social History   Socioeconomic History  . Marital status: Widowed    Spouse name: Not on file  . Number of children: Not on file  . Years of education: Not on file  . Highest education level: Not on file  Occupational History  . Not on file  Social Needs  . Financial resource strain: Not on file  . Food insecurity:    Worry: Not on file    Inability: Not on file  . Transportation needs:    Medical: Not on file    Non-medical: Not on file  Tobacco Use  . Smoking status: Never Smoker  . Smokeless tobacco: Never Used  Substance and Sexual Activity  . Alcohol use: Not Currently  . Drug use: Not Currently  . Sexual activity: Not on file  Lifestyle  . Physical activity:    Days per week: Not on file    Minutes per session: Not on file  . Stress: Not on file  Relationships  . Social connections:    Talks on phone: Not on file    Gets together: Not on file    Attends religious service: Not on file    Active member of club or organization:  Not on file    Attends meetings of clubs or organizations: Not on file    Relationship status: Not on file  Other Topics Concern  . Not on file  Social History Narrative  . Not on file     Review of Systems: A 12 point ROS discussed and pertinent positives are indicated in the HPI above.  All other systems are negative.  Review of Systems  Constitutional: Positive for activity change and fatigue. Negative for fever.  Respiratory: Positive for cough, shortness of breath and wheezing.   Cardiovascular: Negative for chest pain.  Gastrointestinal: Negative for abdominal pain.  Musculoskeletal: Negative for gait problem.  Neurological: Positive for weakness.  Psychiatric/Behavioral: Negative for behavioral problems and confusion.     Vital Signs: BP 137/64 (BP Location: Right Arm)   Pulse 81   Temp 97.6 F (36.4 C) (Oral)   Resp (!) 22   Ht 5\' 4"  (1.626 m)   Wt 146 lb 9.7 oz (66.5 kg)   SpO2 96%   BMI 25.16 kg/m   Physical Exam  Constitutional: She is oriented to person, place, and time.  Cardiovascular: Normal rate and regular rhythm.  Pulmonary/Chest: She is in respiratory distress. She has wheezes in the right upper field, the right middle field, the right lower field, the left upper field, the left middle field and the left lower field.  Abdominal: Soft. Bowel sounds are normal.  Musculoskeletal: Normal range of motion.  Neurological: She is alert and oriented to person, place, and time.  Skin: Skin is warm and dry.  Psychiatric: Her behavior is normal. Her mood appears anxious.  Nursing note and vitals reviewed.   Imaging: Dg Chest 2 View  Result Date: 01/15/2018 CLINICAL DATA:  Chronic dyspnea, productive cough. EXAM: CHEST - 2 VIEW COMPARISON:  Radiographs of January 14, 2018. FINDINGS: Stable cardiomediastinal silhouette. Central pulmonary vascular congestion is noted. Atherosclerosis of thoracic aorta is noted. No pneumothorax is noted. Minimal bilateral pleural effusions are noted. Possible bibasilar subsegmental atelectasis or edema is noted. Bony thorax is unremarkable. IMPRESSION: Central pulmonary vascular congestion is noted with possible mild bibasilar subsegmental atelectasis or edema. Minimal pleural effusions are noted. Aortic Atherosclerosis (ICD10-I70.0). Electronically Signed   By: Lupita RaiderJames  Green Jr, M.D.   On: 01/15/2018 14:18   Dg Chest 2 View  Result Date: 01/14/2018 CLINICAL DATA:  84 year old female with shortness of breath. EXAM: CHEST - 2 VIEW COMPARISON:  Chest radiograph dated 01/21/2009 FINDINGS: There is emphysematous changes of the lungs with flattening of the diaphragms and increased AP diameter of the chest. There is no focal consolidation, pleural effusion, or pneumothorax. A  1.9 cm nodular appearing density in the right lower lung field likely corresponds to vascular structure seen on the lateral view. A pulmonary nodule is less likely but not entirely excluded. This can be further evaluated with chest CT. There is no focal consolidation, pleural effusion, or pneumothorax. Borderline cardiomegaly. Atherosclerotic calcification of the aorta. No acute osseous pathology. IMPRESSION: 1. No acute cardiopulmonary process.  Emphysema. 2. Nodular appearing density in the right lower lobe most likely artifact and related to pulmonary vasculature. CT may provide better evaluation. 3.  Aortic Atherosclerosis (ICD10-I70.0). Electronically Signed   By: Elgie CollardArash  Radparvar M.D.   On: 01/14/2018 01:32   Dg Tibia/fibula Left  Result Date: 01/14/2018 CLINICAL DATA:  Bilateral lower leg pain after falling EXAM: LEFT TIBIA AND FIBULA - 2 VIEW COMPARISON:  None. FINDINGS: There is no evidence of fracture or other focal bone lesions.  Soft tissues are unremarkable. IMPRESSION: Negative. Electronically Signed   By: Deatra Robinson M.D.   On: 01/14/2018 04:35   Dg Tibia/fibula Right  Result Date: 01/14/2018 CLINICAL DATA:  84 year old female with bilateral lower extremity pain. EXAM: RIGHT TIBIA AND FIBULA - 2 VIEW COMPARISON:  Right lower extremity radiograph dated 01/14/2018 FINDINGS: There is no acute fracture or dislocation. The bones are osteopenic. Total right hip arthroplasty appears in anatomic alignment. There is mild diffuse subcutaneous edema. IMPRESSION: 1. No acute fracture or dislocation. 2. Osteopenia. 3. Mild diffuse subcutaneous edema. Electronically Signed   By: Elgie Collard M.D.   On: 01/14/2018 04:47   US Renal  Result Date: 01/14/2018 CLINICAL DATA:  84 year old female with acute renal insufficiency. EXAM: RENAL / URINARY TRACT ULTRASOUND COMPLETE COMPARISON:  None. FINDINGS: Right Kidney: Length: 10.3 cm. Normal echogenicity. No hydronephrosis or shadowing stone. Left Kidney:  Length: 10.6 cm. There is a 3.0 x 3.2 x 3.2 cm upper pole cyst. No hydronephrosis or shadowing stone. Bladder: The urinary bladder is not visualized. IMPRESSION: No hydronephrosis or shadowing stone. Left renal cyst. Electronically Signed   By: Elgie Collard M.D.   On: 01/14/2018 05:10   Dg Chest Port 1 View  Result Date: 01/16/2018 CLINICAL DATA:  Shortness of breath.  Cough. EXAM: PORTABLE CHEST 1 VIEW COMPARISON:  01/15/2018. FINDINGS: Cardiomegaly with pulmonary vascular prominence bilateral interstitial prominence and small bilateral pleural effusions. Findings have progressed from prior exam. No pneumothorax. Degenerative change thoracic spine. IMPRESSION: Findings consistent with congestive heart failure bilateral from interstitial edema bilateral pleural effusions. Findings have progressed from prior exam. Electronically Signed   By: Maisie Fus  Register   On: 01/16/2018 07:03    Labs:  CBC: Recent Labs    01/14/18 0635 01/14/18 1011 01/15/18 0232 01/16/18 0248  WBC 12.0* 11.4* 14.4* 15.3*  HGB 10.1* 10.4* 9.3* 8.7*  HCT 31.8* 32.1* 29.0* 27.5*  PLT 318 361 367 387    COAGS: Recent Labs    01/14/18 0635  INR 1.17  APTT 26    BMP: Recent Labs    01/14/18 0043 01/14/18 0123 01/15/18 0232 01/16/18 0248  NA 129* 129* 132* 134*  K 3.6 3.6 3.9 4.1  CL 97* 97* 102 104  CO2 18*  --  17* 18*  GLUCOSE 106* 104* 173* 129*  BUN 56* 56* 71* 92*  CALCIUM 8.0*  --  8.4* 8.3*  CREATININE 4.72* 5.00* 4.98* 4.95*  GFRNONAA 8*  --  7* 7*  GFRAA 9*  --  8* 8*    LIVER FUNCTION TESTS: Recent Labs    01/14/18 1834 01/15/18 0232 01/16/18 0248  BILITOT 0.6 0.4 0.6  AST 52* 49* 48*  ALT 32 34 40  ALKPHOS 78 77 72  PROT 5.8* 6.2* 5.7*  ALBUMIN 2.4* 2.6* 2.7*    TUMOR MARKERS: No results for input(s): AFPTM, CEA, CA199, CHROMGRNA in the last 8760 hours.  Assessment and Plan:  Extreme SOB Acute renal failure Worsening renal fxn; rising Cr Hematuria Scheduled for  random renal biopsy 7/3 in IR Risks and benefits discussed with the patient including, but not limited to bleeding, infection, damage to adjacent structures or low yield requiring additional tests.  All of the patient's questions were answered, patient is agreeable to proceed. Consent signed and in chart.   Thank you for this interesting consult.  I greatly enjoyed meeting Dana Pham and look forward to participating in their care.  A copy of this report was sent to the requesting  provider on this date.  Electronically Signed: Robet Leu, PA-C 01/16/2018, 11:36 AM   I spent a total of 20 Minutes    in face to face in clinical consultation, greater than 50% of which was counseling/coordinating care for random renal bx

## 2018-01-16 NOTE — Progress Notes (Signed)
Called to room to deliver breathing treatment, patient diaphoretic and tachypneic at this time rrt called at this time, and placed on BIPAP per WOB. Patient tolerating well at this time, RCP will continue to follow.

## 2018-01-16 NOTE — Progress Notes (Signed)
Responded to page from unit nurse to assist patient with AD.  Ad was complete and copies given to patient for self and agents. Copy was given to nurse for patient chart.  Chaplain available as needed.   01/16/18 1406  Clinical Encounter Type  Visited With Patient and family together;Health care provider  Visit Type Initial;Spiritual support;Social support  Referral From Nurse  Spiritual Encounters  Spiritual Needs Literature;Emotional  Stress Factors  Patient Stress Factors None identified  Family Stress Factors None identified  Advance Directives (For Healthcare)  Does Patient Have a Medical Advance Directive? Yes  Does patient want to make changes to medical advance directive? Yes (Inpatient - patient requests chaplain consult to change a medical advance directive)  Type of Advance Directive Healthcare Power of [place];Living will  Copy of Healthcare Power of Attorney in Chart? Yes  Copy of Living Will in Chart? Yes  Fae PippinWatlington, Shakaria Raphael, Chaplain, [facility], Pager [phone]

## 2018-01-16 NOTE — Progress Notes (Signed)
S: Feels better O:BP 137/64 (BP Location: Right Arm)   Pulse (!) 104   Temp 97.6 F (36.4 C) (Oral)   Resp (!) 26   Ht _0  (1.626 m)   Wt 66.5 kg (146 lb 9.7 oz)   SpO2 99%   BMI 25.16 kg/m   Intake/Output Summary (Last 24 hours) at 01/16/2018 1633 Last data filed at 01/16/2018 1532 Gross per 24 hour  Intake 0 ml  Output 1600 ml  Net -1600 ml   Intake/Output: I/O last 3 completed shifts: In: 2266 [P.O.:840; I.V.:1426] Out: 1400 [Urine:1400]  Intake/Output this shift:  Total I/O In: 0  Out: 1000 [Urine:550; Stool:450] Weight change: 4 kg (8 lb 13.1 oz) Gen:WD elderly WF who appears tachypnic  CVS: tachy, no rub Resp:occ rhonchi and decreased BS at bases Abd:+BS, soft, nt Ext: no lower ext edema, trace presacral edema  Recent Labs  Lab 01/14/18 0043 01/14/18 0123 01/14/18 1834 01/15/18 0232 01/16/18 0248  NA 129* 129*  --  132* 134*  K 3.6 3.6  --  3.9 4.1  CL 97* 97*  --  102 104  CO2 18*  --   --  17* 18*  GLUCOSE 106* 104*  --  173* 129*  BUN 56* 56*  --  71* 92*  CREATININE 4.72* 5.00*  --  4.98* 4.95*  ALBUMIN  --   --  2.4* 2.6* 2.7*  CALCIUM 8.0*  --   --  8.4* 8.3*  PHOS  --   --   --   --  5.6*  AST  --   --  52* 49* 48*  ALT  --   --  32 34 40   Liver Function Tests: Recent Labs  Lab 01/14/18 1834 01/15/18 0232 01/16/18 0248  AST 52* 49* 48*  ALT 32 34 40  ALKPHOS 78 77 72  BILITOT 0.6 0.4 0.6  PROT 5.8* 6.2* 5.7*  ALBUMIN 2.4* 2.6* 2.7*   No results for input(s): LIPASE, AMYLASE in the last 168 hours. No results for input(s): AMMONIA in the last 168 hours. CBC: Recent Labs  Lab 01/14/18 0043 01/14/18 0113  01/14/18 0635 01/14/18 1011 01/15/18 0232 01/16/18 0248  WBC 14.6*  --   --  12.0* 11.4* 14.4* 15.3*  NEUTROABS  --  12.0*  --   --   --   --   --   HGB 9.6*  --    < > 10.1* 10.4* 9.3* 8.7*  HCT 30.1*  --    < > 31.8* 32.1* 29.0* 27.5*  MCV 77.6*  --   --  76.4* 75.4* 76.5* 77.5*  PLT 311  --   --  318 361 367 387   < > =  values in this interval not displayed.   Cardiac Enzymes: Recent Labs  Lab 01/14/18 0635 01/16/18 0248  CKTOTAL 1,256* 442*   CBG: No results for input(s): GLUCAP in the last 168 hours.  Iron Studies:  Recent Labs    01/14/18 0635  IRON 14*  TIBC 269  FERRITIN 192   Studies/Results: Dg Chest 2 View  Result Date: 01/15/2018 CLINICAL DATA:  Chronic dyspnea, productive cough. EXAM: CHEST - 2 VIEW COMPARISON:  Radiographs of January 14, 2018. FINDINGS: Stable cardiomediastinal silhouette. Central pulmonary vascular congestion is noted. Atherosclerosis of thoracic aorta is noted. No pneumothorax is noted. Minimal bilateral pleural effusions are noted. Possible bibasilar subsegmental atelectasis or edema is noted. Bony thorax is unremarkable. IMPRESSION: Central pulmonary vascular congestion is noted with  possible mild bibasilar subsegmental atelectasis or edema. Minimal pleural effusions are noted. Aortic Atherosclerosis (ICD10-I70.0). Electronically Signed   By: Marijo Conception, M.D.   On: 01/15/2018 14:18   Dg Chest Port 1 View  Result Date: 01/16/2018 CLINICAL DATA:  Shortness of breath.  Cough. EXAM: PORTABLE CHEST 1 VIEW COMPARISON:  01/15/2018. FINDINGS: Cardiomegaly with pulmonary vascular prominence bilateral interstitial prominence and small bilateral pleural effusions. Findings have progressed from prior exam. No pneumothorax. Degenerative change thoracic spine. IMPRESSION: Findings consistent with congestive heart failure bilateral from interstitial edema bilateral pleural effusions. Findings have progressed from prior exam. Electronically Signed   By: [place]   On: 01/16/2018 07:03   . amLODipine  10 mg Oral Daily  . levalbuterol  1.25 mg Nebulization TID  . levothyroxine  25 mcg Oral QAC breakfast  . mometasone-formoterol  2 puff Inhalation BID  . pantoprazole  40 mg Oral Q1200    BMET    Component Value Date/Time   NA 134 (L) 01/16/2018 0248   K 4.1 01/16/2018  0248   CL 104 01/16/2018 0248   CO2 18 (L) 01/16/2018 0248   GLUCOSE 129 (H) 01/16/2018 0248   BUN 92 (H) 01/16/2018 0248   CREATININE 4.95 (H) 01/16/2018 0248   CALCIUM 8.3 (L) 01/16/2018 0248   GFRNONAA 7 (L) 01/16/2018 0248   GFRAA 8 (L) 01/16/2018 0248   CBC    Component Value Date/Time   WBC 15.3 (H) 01/16/2018 0248   RBC 3.55 (L) 01/16/2018 0248   HGB 8.7 (L) 01/16/2018 0248   HCT 27.5 (L) 01/16/2018 0248   PLT 387 01/16/2018 0248   MCV 77.5 (L) 01/16/2018 0248   MCH 24.5 (L) 01/16/2018 0248   MCHC 31.6 01/16/2018 0248   RDW 16.4 (H) 01/16/2018 0248   LYMPHSABS 1.2 01/14/2018 0113   MONOABS 0.9 01/14/2018 0113   EOSABS 0.1 01/14/2018 0113   BASOSABS 0.0 01/14/2018 0113   Assessment/Plan:  1. AKI- non-oliguric and a significant change from her baseline Cr of 0.93 on 12/14/17.  She did note gross hematuria 2-3 days prior to admission but reports that it has cleared up.  She does have a history of nephrolithiasis, however [place] was without evidence of obstruction or stones.  Serologies pending but we did discuss the likely need for a renal biopsy as we do not have any etiology at this time.  She denies any N/V/D, dysuria, pyuria, retention or urgency.  No NSAIDs or recent antibiotics. 1. Normal C3 and C4, elevated CK at 1256, ESR elevated at 40, negative hepatitis panel, SPEP and UPEP pending as well as ANCA.  negative dsDNA, ASO, ANA.  urine eosinophils pending for AIN as she is on a PPI.   2. Plan for renal biopsy tomorrow with IR. 2. Acute on chronic respiratory distress- pt with history of COPD and IVF's stopped this morning due to some SOB.  some pulmonary edema seen on CXR will give dose of IV lasix and follow.  If she does not respond to IV lasix we may need to proceed with HD for UF if she is amenable 3. Anemia- pt with history of hematochezia.  Hgb was 11 in May, GI eval per primary 4. COPD- on home O2 5. Hyponatremia- due to #1 and #2, improved after IVF's.   6. Hypothyroidism- on replacement and TSH low at 0.032 per primary 7. Disposition- will likely need SNF placement due to deconditioning   Donetta Potts, MD [facility] [phone]

## 2018-01-16 NOTE — Significant Event (Signed)
Rapid Response Event Note  Overview: Respiratory  Initial Focused Assessment: Called by RT to assess patient for respiratory distress. Upon arrival, patient was sitting up in the bed, tripoding, + WOB, + SOB, + air hungry. Lung sounds coarse crackles throughout and wheezing present. Patient had received Xopenox prior to my arrival. + Diaphoresis, skin clammy, + pulse. SBP in the 160s, saturations were 98% on NRB 15L. [facility].  Interventions: - STAT CXR - Lasix 60 mg IV - BIPAP   Plan of Care: - Monitor respiratory status   Event Summary:   at    Call Time 0610 Arrival Time 0612 End Time 645  Shawnese Magner R

## 2018-01-17 ENCOUNTER — Encounter (HOSPITAL_COMMUNITY): Payer: Self-pay | Admitting: Interventional Radiology

## 2018-01-17 ENCOUNTER — Inpatient Hospital Stay (HOSPITAL_COMMUNITY): Payer: Medicare PPO

## 2018-01-17 DIAGNOSIS — K921 Melena: Secondary | ICD-10-CM

## 2018-01-17 DIAGNOSIS — I5032 Chronic diastolic (congestive) heart failure: Secondary | ICD-10-CM

## 2018-01-17 DIAGNOSIS — J9611 Chronic respiratory failure with hypoxia: Secondary | ICD-10-CM

## 2018-01-17 DIAGNOSIS — J431 Panlobular emphysema: Secondary | ICD-10-CM

## 2018-01-17 HISTORY — PX: IR US GUIDE BX ASP/DRAIN: IMG2392

## 2018-01-17 LAB — BASIC METABOLIC PANEL
ANION GAP: 14 (ref 5–15)
BUN: 107 mg/dL — ABNORMAL HIGH (ref 8–23)
CALCIUM: 8.1 mg/dL — AB (ref 8.9–10.3)
CO2: 19 mmol/L — AB (ref 22–32)
Chloride: 106 mmol/L (ref 98–111)
Creatinine, Ser: 4.55 mg/dL — ABNORMAL HIGH (ref 0.44–1.00)
GFR, EST AFRICAN AMERICAN: 9 mL/min — AB (ref >60)
GFR, EST NON AFRICAN AMERICAN: 8 mL/min — AB (ref >60)
GLUCOSE: 82 mg/dL (ref 70–99)
POTASSIUM: 4 mmol/L (ref 3.5–5.1)
Sodium: 139 mmol/L (ref 135–145)

## 2018-01-17 LAB — CBC
HEMATOCRIT: 32.2 % — AB (ref 36.0–46.0)
Hemoglobin: 10 g/dL — ABNORMAL LOW (ref 12.0–15.0)
MCH: 24.3 pg — AB (ref 26.0–34.0)
MCHC: 31.1 g/dL (ref 30.0–36.0)
MCV: 78.2 fL (ref 78.0–100.0)
Platelets: 459 10*3/uL — ABNORMAL HIGH (ref 150–400)
RBC: 4.12 MIL/uL (ref 3.87–5.11)
RDW: 16.7 % — ABNORMAL HIGH (ref 11.5–15.5)
WBC: 9.7 10*3/uL (ref 4.0–10.5)

## 2018-01-17 LAB — GLOMERULAR BASEMENT MEMBRANE ANTIBODIES: GBM Ab: 3 units (ref 0–20)

## 2018-01-17 MED ORDER — LIDOCAINE HCL (PF) 1 % IJ SOLN
INTRAMUSCULAR | Status: AC | PRN
  Administered 2018-01-17: 5 mL

## 2018-01-17 MED ORDER — FENTANYL CITRATE (PF) 100 MCG/2ML IJ SOLN
INTRAMUSCULAR | Status: AC | PRN
  Administered 2018-01-17: 50 ug via INTRAVENOUS

## 2018-01-17 MED ORDER — MIDAZOLAM HCL 2 MG/2ML IJ SOLN
INTRAMUSCULAR | Status: AC | PRN
  Administered 2018-01-17: 1 mg via INTRAVENOUS

## 2018-01-17 MED ORDER — MIDAZOLAM HCL 2 MG/2ML IJ SOLN
INTRAMUSCULAR | Status: AC
  Administered 2018-01-17: 15:00:00
  Filled 2018-01-17: qty 2

## 2018-01-17 MED ORDER — FENTANYL CITRATE (PF) 100 MCG/2ML IJ SOLN
INTRAMUSCULAR | Status: AC
  Administered 2018-01-17: 15:00:00
  Filled 2018-01-17: qty 2

## 2018-01-17 MED ORDER — LIDOCAINE HCL 1 % IJ SOLN
INTRAMUSCULAR | Status: AC
  Administered 2018-01-17: 15:00:00
  Filled 2018-01-17: qty 20

## 2018-01-17 MED ORDER — FUROSEMIDE 10 MG/ML IJ SOLN
60.0000 mg | Freq: Once | INTRAMUSCULAR | Status: AC
  Administered 2018-01-17: 60 mg via INTRAVENOUS
  Filled 2018-01-17: qty 6

## 2018-01-17 NOTE — Progress Notes (Signed)
PT Cancellation Note  Patient Details Name: Cena BentonDorothy W Hartzog MRN: [id number] DOB: 22-Aug-1931   Cancelled Treatment:    Reason Eval/Treat Not Completed: (P) Other (comment) Pt awaiting renal biopsy and was removed from BiPAP this morning. RN request allowing pt to rest and deferring treatment until after biopsy.  PT will follow back this afternoon as able.  Tawana Pasch B. Beverely RisenVan Fleet PT, DPT Acute Rehabilitation  [phone] Pager [phone]     Elon Alaslizabeth B Van Fleet 01/17/2018, 11:43 AM

## 2018-01-17 NOTE — Progress Notes (Addendum)
PROGRESS NOTE    Dana Pham  GEX:[id number] DOB: Aug 09, 1931 DOA: 01/14/2018 PCP: Patient, No Pcp Per   Brief Narrative:  84 year old WF  PMHx COPD, chronic respiratory failure with hypoxia on 3 L O2 at home, Diastolic CHF, hypothyroidism, GERD   Presented to the emergency room with worsening dyspnea 1 week. She's been staying with her sister in [place] for the last 2-3 weeks, originally from [place]. -Patient also reports mild intermittent bright red blood per rectum, off and on for years couple episodes this past week. -on admission creatinine was 5, hemoglobin 10.4, chest x-ray without acute findings, no significant edema on exam. -Very poor historian, unfortunately no records in [place] -nephrology consulted, given IV fluids, subsequently developed volume overload/respiratory distress, requiring BiPAP and diuretics -IR consulted for renal biopsy      Subjective: 7/3 A/O x4, positive acute on chronic S OB, negative CP, negative abdominal pain.  Positive productive cough (white sputum)   Assessment & Plan:   Principal Problem:   Acute on chronic respiratory failure with hypoxia (HCC) Active Problems:   Hyponatremia   AKI (acute kidney injury) (HCC)   Microcytic anemia   Positive fecal occult blood test   Bilateral leg pain   GIB (gastrointestinal bleeding)   Fall   COPD exacerbation (HCC)   Acute on chronic respiratory failure with hypoxia -Multifactorial COPD, CHF, acute kidney injury. -Appears [facility] have been [facility] - Currently with bilateral pleural effusion - Titrate O2 to maintain SPO2 89 to 93% - COPD/Chronic Respiratory failure with Hypoxia on 3 L O2 at home   Chronic diastolic CHF (base weight?) -Strict in and out -Daily weight - Transfuse for hemoglobin<8 -Lasix IV 60 mg x 1.  Patient clearly fluid overloaded will try to gently diuresis will have to watch kidney function closely   Acute kidney injury  (baseline Cr 0.9) Recent Labs  Lab 01/14/18 0043 01/14/18 0123 01/15/18 0232 01/16/18 0248 01/17/18 0324  CREATININE 4.72* 5.00* 4.98* 4.95* 4.55*  - Renal biopsy pending -Renal ultrasound negative hydronephrosis -Hold all nephrotoxins - Denies excessive NSAID use. - Nephrology following - SPEP - Polyclonal gammopathy nonspecific  Chronic intermittent hematuria hematochezia -History not suggestive of upper GI bleed - Protonix  40 mg daily -Anemia panel suggestive of iron deficiency - We will counsel patient on need for outpatient evaluation to include colonoscopy.  NOTE patient has declined colonoscopy in the past.  Anemia (baseline HgB= 11.4 in 11/2017) -Trend closely. - Occult blood pending  Hyponatremia - Most likely secondary to CHF/volume overload  - should improve with diuresis  Hypothyroidism -TSH pending  -Synthroid 25 mcg daily     DVT prophylaxis: SCD Code Status: DNR Family Communication: Sister is at bedside for discussion of plan of care Disposition Plan: TBD   Consultants:  Nephrology     Procedures/Significant Events:  6/30 echocardiogram:Left ventricle: EF= 65% to 70%.  -Grade 1 diastolic dysfunction). - Left atrium:  moderately dilated.     I have personally reviewed and interpreted all radiology studies and my findings are as above.  VENTILATOR SETTINGS:    Cultures 6/30 respiratory virus panel negative 6/30 acute hepatitis panel negative 6/30 strep pneumo urine antigen negative 6/30 blood NGTD     Antimicrobials: Anti-infectives (From admission, onward)   Start     Stop   01/15/18 0100  cefTRIAXone (ROCEPHIN) 1 g in sodium chloride 0.9 % 100 mL IVPB  Status:  Discontinued     01/14/18 1040   01/14/18  0145  cefTRIAXone (ROCEPHIN) 1 g in sodium chloride 0.9 % 100 mL IVPB     01/14/18 0507       Devices    LINES / TUBES:      Continuous Infusions:   Objective: Vitals:   01/17/18 0356 01/17/18 0500 01/17/18  0800 01/17/18 0805  BP:      Pulse:      Resp: (!) 22     Temp:      TempSrc:      SpO2: 95%  98% 95%  Weight:  134 lb 7.7 oz (61 kg)    Height:        Intake/Output Summary (Last 24 hours) at 01/17/2018 0805 Last data filed at 01/17/2018 0500 Gross per 24 hour  Intake 176.26 ml  Output 2300 ml  Net -2123.74 ml   Filed Weights   01/15/18 0102 01/16/18 0500 01/17/18 0500  Weight: 137 lb 12.6 oz (62.5 kg) 146 lb 9.7 oz (66.5 kg) 134 lb 7.7 oz (61 kg)    Examination:  General: A/O x4, positive acute on chronic  respiratory distress, cachectic ENT: Negative Runny nose, negative gingival bleeding, poor dentation Neck:  Negative scars, masses, torticollis, lymphadenopathy, JVD Lungs: diffuse poor air movement, diffuse bibasilar rhonchi, diffuse expiratory wheeze, Cardiovascular: Tachycardic, regular rhythm without murmur gallop or rub normal S1 and S2 Abdomen: negative abdominal pain, nondistended, positive soft, bowel sounds, no rebound, no ascites, no appreciable mass Extremities: No significant cyanosis, clubbing, or edema bilateral lower extremities Skin: Negative rashes, lesions, ulcers Psychiatric:  Negative depression, negative anxiety, negative fatigue, negative mania  Central nervous system:  Cranial nerves II through XII intact, tongue/uvula midline, all extremities muscle strength 5/5, sensation intact throughout,  negative dysarthria, negative expressive aphasia, negative receptive aphasia.  .     Data Reviewed: Care during the described time interval was provided by me .  I have reviewed this patient's available data, including medical history, events of note, physical examination, and all test results as part of my evaluation.   CBC: Recent Labs  Lab 01/14/18 0113  01/14/18 0635 01/14/18 1011 01/15/18 0232 01/16/18 0248 01/17/18 0324  WBC  --   --  12.0* 11.4* 14.4* 15.3* 9.7  NEUTROABS 12.0*  --   --   --   --   --   --   HGB  --    < > 10.1* 10.4* 9.3* 8.7*  10.0*  HCT  --    < > 31.8* 32.1* 29.0* 27.5* 32.2*  MCV  --   --  76.4* 75.4* 76.5* 77.5* 78.2  PLT  --   --  318 361 367 387 459*   < > = values in this interval not displayed.   Basic Metabolic Panel: Recent Labs  Lab 01/14/18 0043 01/14/18 0123 01/15/18 0232 01/16/18 0248 01/17/18 0324  NA 129* 129* 132* 134* 139  K 3.6 3.6 3.9 4.1 4.0  CL 97* 97* 102 104 106  CO2 18*  --  17* 18* 19*  GLUCOSE 106* 104* 173* 129* 82  BUN 56* 56* 71* 92* 107*  CREATININE 4.72* 5.00* 4.98* 4.95* 4.55*  CALCIUM 8.0*  --  8.4* 8.3* 8.1*  PHOS  --   --   --  5.6*  --    GFR: Estimated Creatinine Clearance: 8.1 mL/min (A) (by C-G formula based on SCr of 4.55 mg/dL (H)). Liver Function Tests: Recent Labs  Lab 01/14/18 1834 01/15/18 0232 01/16/18 0248  AST 52* 49* 48*  ALT 32  34 40  ALKPHOS 78 77 72  BILITOT 0.6 0.4 0.6  PROT 5.8* 6.2* 5.7*  ALBUMIN 2.4* 2.6* 2.7*   No results for input(s): LIPASE, AMYLASE in the last 168 hours. No results for input(s): AMMONIA in the last 168 hours. Coagulation Profile: Recent Labs  Lab 01/14/18 0635  INR 1.17   Cardiac Enzymes: Recent Labs  Lab 01/14/18 0635 01/16/18 0248  CKTOTAL 1,256* 442*   BNP (last 3 results) No results for input(s): PROBNP in the last 8760 hours. HbA1C: Recent Labs    01/14/18 1058  HGBA1C 5.9*   CBG: No results for input(s): GLUCAP in the last 168 hours. Lipid Profile: No results for input(s): CHOL, HDL, LDLCALC, TRIG, CHOLHDL, LDLDIRECT in the last 72 hours. Thyroid Function Tests: Recent Labs    01/16/18 1324  TSH 0.017*   Anemia Panel: No results for input(s): VITAMINB12, FOLATE, FERRITIN, TIBC, IRON, RETICCTPCT in the last 72 hours. Urine analysis:    Component Value Date/Time   COLORURINE STRAW (A) 01/14/2018 0850   APPEARANCEUR CLEAR 01/14/2018 0850   LABSPEC 1.005 01/14/2018 0850   PHURINE 6.0 01/14/2018 0850   GLUCOSEU NEGATIVE 01/14/2018 0850   HGBUR LARGE (A) 01/14/2018 0850    BILIRUBINUR NEGATIVE 01/14/2018 0850   KETONESUR NEGATIVE 01/14/2018 0850   PROTEINUR 30 (A) 01/14/2018 0850   NITRITE NEGATIVE 01/14/2018 0850   LEUKOCYTESUR SMALL (A) 01/14/2018 0850   Sepsis Labs: @LABRCNTIP (procalcitonin:4,lacticidven:4)  ) Recent Results (from the past 240 hour(s))  Respiratory Panel by PCR     Status: None   Collection Time: 01/14/18  3:03 AM  Result Value Ref Range Status   Adenovirus NOT DETECTED NOT DETECTED Final   Coronavirus 229E NOT DETECTED NOT DETECTED Final   Coronavirus HKU1 NOT DETECTED NOT DETECTED Final   Coronavirus NL63 NOT DETECTED NOT DETECTED Final   Coronavirus OC43 NOT DETECTED NOT DETECTED Final   Metapneumovirus NOT DETECTED NOT DETECTED Final   Rhinovirus / Enterovirus NOT DETECTED NOT DETECTED Final   Influenza A NOT DETECTED NOT DETECTED Final   Influenza B NOT DETECTED NOT DETECTED Final   Parainfluenza Virus 1 NOT DETECTED NOT DETECTED Final   Parainfluenza Virus 2 NOT DETECTED NOT DETECTED Final   Parainfluenza Virus 3 NOT DETECTED NOT DETECTED Final   Parainfluenza Virus 4 NOT DETECTED NOT DETECTED Final   Respiratory Syncytial Virus NOT DETECTED NOT DETECTED Final   Bordetella pertussis NOT DETECTED NOT DETECTED Final   Chlamydophila pneumoniae NOT DETECTED NOT DETECTED Final   Mycoplasma pneumoniae NOT DETECTED NOT DETECTED Final    Comment: Performed at [facility], [address] [place], [place] [address]  Culture, blood (x 2)     Status: None (Preliminary result)   Collection Time: 01/14/18  6:32 AM  Result Value Ref Range Status   Specimen Description BLOOD RIGHT ANTECUBITAL  Final   Special Requests   Final    BOTTLES DRAWN AEROBIC ONLY Blood Culture adequate volume   Culture   Final    NO GROWTH 2 DAYS Performed at [facility], [address] [place], [place] [address]    Report Status PENDING  Incomplete  Culture, blood (x 2)     Status: None (Preliminary result)   Collection Time: 01/14/18   6:37 AM  Result Value Ref Range Status   Specimen Description BLOOD RIGHT HAND  Final   Special Requests   Final    BOTTLES DRAWN AEROBIC ONLY Blood Culture adequate volume   Culture  Final    NO GROWTH 2 DAYS Performed at [facility], [address] [place], [place] [address]    Report Status PENDING  Incomplete         Radiology Studies: Dg Chest 2 View  Result Date: 01/15/2018 CLINICAL DATA:  Chronic dyspnea, productive cough. EXAM: CHEST - 2 VIEW COMPARISON:  Radiographs of January 14, 2018. FINDINGS: Stable cardiomediastinal silhouette. Central pulmonary vascular congestion is noted. Atherosclerosis of thoracic aorta is noted. No pneumothorax is noted. Minimal bilateral pleural effusions are noted. Possible bibasilar subsegmental atelectasis or edema is noted. Bony thorax is unremarkable. IMPRESSION: Central pulmonary vascular congestion is noted with possible mild bibasilar subsegmental atelectasis or edema. Minimal pleural effusions are noted. Aortic Atherosclerosis (ICD10-I70.0). Electronically Signed   By: Lupita Raider, M.D.   On: 01/15/2018 14:18   Dg Chest Port 1 View  Result Date: 01/16/2018 CLINICAL DATA:  Shortness of breath.  Cough. EXAM: PORTABLE CHEST 1 VIEW COMPARISON:  01/15/2018. FINDINGS: Cardiomegaly with pulmonary vascular prominence bilateral interstitial prominence and small bilateral pleural effusions. Findings have progressed from prior exam. No pneumothorax. Degenerative change thoracic spine. IMPRESSION: Findings consistent with congestive heart failure bilateral from interstitial edema bilateral pleural effusions. Findings have progressed from prior exam. Electronically Signed   By: Maisie Fus  Register   On: 01/16/2018 07:03        Scheduled Meds: . amLODipine  10 mg Oral Daily  . levalbuterol  1.25 mg Nebulization TID  . levothyroxine  25 mcg Oral QAC breakfast  . mometasone-formoterol  2 puff Inhalation BID  . pantoprazole  40 mg Oral Q1200    Continuous Infusions:   LOS: 3 days    Time spent: 40 minutes    Ayansh Feutz, Roselind Messier, MD [facility] Pager [phone]   If 7PM-7AM, please contact night-coverage [email or web address] Password [email or web address] 01/17/2018, 8:05 AM

## 2018-01-17 NOTE — Progress Notes (Signed)
S:Feels well O:BP (!) 129/44 (BP Location: Right Arm)   Pulse 100   Temp 97.8 F (36.6 C) (Oral)   Resp (!) 24   Ht 5' 4"  (1.626 m)   Wt 61 kg (134 lb 7.7 oz)   SpO2 98%   BMI 23.08 kg/m   Intake/Output Summary (Last 24 hours) at 01/17/2018 1234 Last data filed at 01/17/2018 0500 Gross per 24 hour  Intake 176.26 ml  Output 1750 ml  Net -1573.74 ml   Intake/Output: I/O last 3 completed shifts: In: 176.3 [P.O.:118; IV Piggyback:58.3] Out: 2900 [Urine:2450; Stool:450]  Intake/Output this shift:  No intake/output data recorded. Weight change: -5.5 kg (-12 lb 2 oz) Gen: nad CVS: no rub Resp: scattered rhonchi Abd: benign Ext: trace edema  Recent Labs  Lab 01/14/18 0043 01/14/18 0123 01/14/18 1834 01/15/18 0232 01/16/18 0248 01/17/18 0324  NA 129* 129*  --  132* 134* 139  K 3.6 3.6  --  3.9 4.1 4.0  CL 97* 97*  --  102 104 106  CO2 18*  --   --  17* 18* 19*  GLUCOSE 106* 104*  --  173* 129* 82  BUN 56* 56*  --  71* 92* 107*  CREATININE 4.72* 5.00*  --  4.98* 4.95* 4.55*  ALBUMIN  --   --  2.4* 2.6* 2.7*  --   CALCIUM 8.0*  --   --  8.4* 8.3* 8.1*  PHOS  --   --   --   --  5.6*  --   AST  --   --  52* 49* 48*  --   ALT  --   --  32 34 40  --    Liver Function Tests: Recent Labs  Lab 01/14/18 1834 01/15/18 0232 01/16/18 0248  AST 52* 49* 48*  ALT 32 34 40  ALKPHOS 78 77 72  BILITOT 0.6 0.4 0.6  PROT 5.8* 6.2* 5.7*  ALBUMIN 2.4* 2.6* 2.7*   No results for input(s): LIPASE, AMYLASE in the last 168 hours. No results for input(s): AMMONIA in the last 168 hours. CBC: Recent Labs  Lab 01/14/18 0113  01/14/18 0635 01/14/18 1011 01/15/18 0232 01/16/18 0248 01/17/18 0324  WBC  --   --  12.0* 11.4* 14.4* 15.3* 9.7  NEUTROABS 12.0*  --   --   --   --   --   --   HGB  --    < > 10.1* 10.4* 9.3* 8.7* 10.0*  HCT  --    < > 31.8* 32.1* 29.0* 27.5* 32.2*  MCV  --   --  76.4* 75.4* 76.5* 77.5* 78.2  PLT  --   --  318 361 367 387 459*   < > = values in this  interval not displayed.   Cardiac Enzymes: Recent Labs  Lab 01/14/18 0635 01/16/18 0248  CKTOTAL 1,256* 442*   CBG: No results for input(s): GLUCAP in the last 168 hours.  Iron Studies: No results for input(s): IRON, TIBC, TRANSFERRIN, FERRITIN in the last 72 hours. Studies/Results: Dg Chest 2 View  Result Date: 01/15/2018 CLINICAL DATA:  Chronic dyspnea, productive cough. EXAM: CHEST - 2 VIEW COMPARISON:  Radiographs of January 14, 2018. FINDINGS: Stable cardiomediastinal silhouette. Central pulmonary vascular congestion is noted. Atherosclerosis of thoracic aorta is noted. No pneumothorax is noted. Minimal bilateral pleural effusions are noted. Possible bibasilar subsegmental atelectasis or edema is noted. Bony thorax is unremarkable. IMPRESSION: Central pulmonary vascular congestion is noted with possible mild bibasilar subsegmental  atelectasis or edema. Minimal pleural effusions are noted. Aortic Atherosclerosis (ICD10-I70.0). Electronically Signed   By: Marijo Conception, M.D.   On: 01/15/2018 14:18   Dg Chest Port 1 View  Result Date: 01/16/2018 CLINICAL DATA:  Shortness of breath.  Cough. EXAM: PORTABLE CHEST 1 VIEW COMPARISON:  01/15/2018. FINDINGS: Cardiomegaly with pulmonary vascular prominence bilateral interstitial prominence and small bilateral pleural effusions. Findings have progressed from prior exam. No pneumothorax. Degenerative change thoracic spine. IMPRESSION: Findings consistent with congestive heart failure bilateral from interstitial edema bilateral pleural effusions. Findings have progressed from prior exam. Electronically Signed   By: [place]   On: 01/16/2018 07:03   . amLODipine  10 mg Oral Daily  . levalbuterol  1.25 mg Nebulization TID  . levothyroxine  25 mcg Oral QAC breakfast  . mometasone-formoterol  2 puff Inhalation BID  . pantoprazole  40 mg Oral Q1200    BMET    Component Value Date/Time   NA 139 01/17/2018 0324   K 4.0 01/17/2018 0324   CL 106  01/17/2018 0324   CO2 19 (L) 01/17/2018 0324   GLUCOSE 82 01/17/2018 0324   BUN 107 (H) 01/17/2018 0324   CREATININE 4.55 (H) 01/17/2018 0324   CALCIUM 8.1 (L) 01/17/2018 0324   GFRNONAA 8 (L) 01/17/2018 0324   GFRAA 9 (L) 01/17/2018 0324   CBC    Component Value Date/Time   WBC 9.7 01/17/2018 0324   RBC 4.12 01/17/2018 0324   HGB 10.0 (L) 01/17/2018 0324   HCT 32.2 (L) 01/17/2018 0324   PLT 459 (H) 01/17/2018 0324   MCV 78.2 01/17/2018 0324   MCH 24.3 (L) 01/17/2018 0324   MCHC 31.1 01/17/2018 0324   RDW 16.7 (H) 01/17/2018 0324   LYMPHSABS 1.2 01/14/2018 0113   MONOABS 0.9 01/14/2018 0113   EOSABS 0.1 01/14/2018 0113   BASOSABS 0.0 01/14/2018 0113    Assessment/Plan:  1. AKI- non-oliguric and a significant change from her baseline Cr of 0.93 on 12/14/17. She did note gross hematuria 2-3 days prior to admission but reports that it has cleared up. She does have a history of nephrolithiasis, however [place] was without evidence of obstruction or stones. Serologies pending but we did discuss the likely need for a renal biopsy as we do not have any etiology at this time. She denies any N/V/D, dysuria, pyuria, retention or urgency. No NSAIDs or recent antibiotics. 1. Normal C3 and C4, elevated CK at 1256, ESR elevated at 40, negative hepatitis panel, negative SPEP and UPEP, ANCA, dsDNA, ASO, and ANA.  urine eosinophils pending for AIN as she is on a PPI.  2. Plan for renal biopsy today with IR. 2. Acute on chronic respiratory distress- pt with history of COPD and IVF's stopped this morning due to some SOB. some pulmonary edema seen on CXR. 1. Good diuresis with one dose of lasix without significant change in Cr but with bump in BUN. 2. Continue to follow 3. Anemia- pt with history of hematochezia. Hgb was 11 in May, GI eval per primary 4. COPD- on home O2 5. Hyponatremia- due to #1 and #2, improved after IVF's.  6. Hypothyroidism- on replacement and TSH low at 0.032 per  primary 7. Disposition- will likely need SNF placement due to deconditioning   Dana Potts, MD [facility] [phone]

## 2018-01-17 NOTE — Progress Notes (Signed)
Patient with frequent premature ventricular contractions on telemetry.  She had a nonsustained run; 5 PVC's.  The patient is asymptomatic.  The provider on call was notified.  Will continue to monitor.

## 2018-01-17 NOTE — Procedures (Signed)
Random renal Bx R kidney 16 g times two EBL 0 Comp 0

## 2018-01-17 NOTE — Progress Notes (Signed)
OT Cancellation Note  Patient Details Name: Cena BentonDorothy W Pundt MRN: [id number] DOB: [birth year]   Cancelled Treatment:    Reason Eval/Treat Not Completed: Other (comment): Pt awaiting renal biopsy today and RN requesting OT return at a later time to continue with plan of care as pt requiring BiPAP overnight and needs to rest prior to biopsy. Will check back as able.   Doristine Sectionharity A Khamora Karan, MS OTR/L  Pager: [phone]   Ailey Wessling A Aristotelis Vilardi 01/17/2018, 10:30 AM

## 2018-01-17 NOTE — Care Management Important Message (Signed)
Important Message  Patient Details  Name: Dana BentonDorothy W Pham MRN: [id number] Date of Birth: 1932-01-01   Medicare Important Message Given:  Yes    Dana Pham 01/17/2018, 2:49 PM

## 2018-01-18 DIAGNOSIS — I4891 Unspecified atrial fibrillation: Secondary | ICD-10-CM

## 2018-01-18 LAB — TYPE AND SCREEN
ABO/RH(D): A POS
ANTIBODY SCREEN: POSITIVE
Donor AG Type: NEGATIVE
Donor AG Type: NEGATIVE
PT AG TYPE: NEGATIVE
Unit division: 0
Unit division: 0

## 2018-01-18 LAB — CBC
HCT: 31.7 % — ABNORMAL LOW (ref 36.0–46.0)
HEMOGLOBIN: 9.9 g/dL — AB (ref 12.0–15.0)
MCH: 24 pg — AB (ref 26.0–34.0)
MCHC: 31.2 g/dL (ref 30.0–36.0)
MCV: 76.9 fL — AB (ref 78.0–100.0)
Platelets: 465 10*3/uL — ABNORMAL HIGH (ref 150–400)
RBC: 4.12 MIL/uL (ref 3.87–5.11)
RDW: 16.6 % — ABNORMAL HIGH (ref 11.5–15.5)
WBC: 9.7 10*3/uL (ref 4.0–10.5)

## 2018-01-18 LAB — BPAM RBC
Blood Product Expiration Date: 201907262359
Blood Product Expiration Date: 201907262359
UNIT TYPE AND RH: 5100
UNIT TYPE AND RH: 5100

## 2018-01-18 LAB — BASIC METABOLIC PANEL
Anion gap: 14 (ref 5–15)
BUN: 95 mg/dL — AB (ref 8–23)
CO2: 22 mmol/L (ref 22–32)
CREATININE: 3.85 mg/dL — AB (ref 0.44–1.00)
Calcium: 8.5 mg/dL — ABNORMAL LOW (ref 8.9–10.3)
Chloride: 104 mmol/L (ref 98–111)
GFR calc Af Amer: 12 mL/min — ABNORMAL LOW (ref >60)
GFR, EST NON AFRICAN AMERICAN: 10 mL/min — AB (ref >60)
GLUCOSE: 109 mg/dL — AB (ref 70–99)
POTASSIUM: 3.6 mmol/L (ref 3.5–5.1)
Sodium: 140 mmol/L (ref 135–145)

## 2018-01-18 LAB — RENAL FUNCTION PANEL
ANION GAP: 13 (ref 5–15)
Albumin: 2.6 g/dL — ABNORMAL LOW (ref 3.5–5.0)
BUN: 95 mg/dL — ABNORMAL HIGH (ref 8–23)
CHLORIDE: 106 mmol/L (ref 98–111)
CO2: 22 mmol/L (ref 22–32)
CREATININE: 3.62 mg/dL — AB (ref 0.44–1.00)
Calcium: 8.4 mg/dL — ABNORMAL LOW (ref 8.9–10.3)
GFR, EST AFRICAN AMERICAN: 12 mL/min — AB (ref >60)
GFR, EST NON AFRICAN AMERICAN: 11 mL/min — AB (ref >60)
Glucose, Bld: 110 mg/dL — ABNORMAL HIGH (ref 70–99)
Phosphorus: 5.3 mg/dL — ABNORMAL HIGH (ref 2.5–4.6)
Potassium: 3.7 mmol/L (ref 3.5–5.1)
Sodium: 141 mmol/L (ref 135–145)

## 2018-01-18 LAB — MAGNESIUM: Magnesium: 1.5 mg/dL — ABNORMAL LOW (ref 1.7–2.4)

## 2018-01-18 MED ORDER — DILTIAZEM LOAD VIA INFUSION
10.0000 mg | Freq: Once | INTRAVENOUS | Status: DC
  Filled 2018-01-18: qty 10

## 2018-01-18 MED ORDER — MAGNESIUM SULFATE 50 % IJ SOLN
3.0000 g | Freq: Once | INTRAMUSCULAR | Status: AC
  Administered 2018-01-18: 3 g via INTRAVENOUS
  Filled 2018-01-18: qty 6

## 2018-01-18 MED ORDER — FUROSEMIDE 10 MG/ML IJ SOLN
60.0000 mg | Freq: Once | INTRAMUSCULAR | Status: AC
  Administered 2018-01-18: 60 mg via INTRAVENOUS
  Filled 2018-01-18: qty 6

## 2018-01-18 MED ORDER — DILTIAZEM HCL-DEXTROSE 100-5 MG/100ML-% IV SOLN (PREMIX)
5.0000 mg/h | INTRAVENOUS | Status: DC
  Filled 2018-01-18: qty 100

## 2018-01-18 MED ORDER — DILTIAZEM LOAD VIA INFUSION
10.0000 mg | Freq: Once | INTRAVENOUS | Status: AC
  Administered 2018-01-18: 10 mg via INTRAVENOUS
  Filled 2018-01-18: qty 10

## 2018-01-18 MED ORDER — DILTIAZEM HCL-DEXTROSE 100-5 MG/100ML-% IV SOLN (PREMIX)
5.0000 mg/h | INTRAVENOUS | Status: DC
  Administered 2018-01-18 – 2018-01-19 (×2): 5 mg/h via INTRAVENOUS
  Filled 2018-01-18 (×2): qty 100

## 2018-01-18 MED ORDER — METOPROLOL TARTRATE 5 MG/5ML IV SOLN
INTRAVENOUS | Status: AC
  Administered 2018-01-18: 5 mg
  Filled 2018-01-18: qty 5

## 2018-01-18 NOTE — Progress Notes (Signed)
PT Cancellation Note  Patient Details Name: Cena BentonDorothy W Vinal MRN: [id number] DOB: March 18, 1932   Cancelled Treatment:    Reason Eval/Treat Not Completed: Medical issues which prohibited therapy   Attempted PT eval, however pt's heart rate is varying dramatically; noted as high as 167 at rest; RN aware and starting EKG;   Will follow up for PT at a later date;   Van ClinesHolly Leida [place], [place]  Acute Rehabilitation Services Pager [phone] Office [phone]    Levi AlandHolly H Anjanette Gilkey 01/18/2018, 12:00 PM

## 2018-01-18 NOTE — Progress Notes (Signed)
S: Eating on the edge of the bed.  Feels better O:BP (!) 160/61 (BP Location: Right Arm)   Pulse 90   Temp 97.8 F (36.6 C) (Axillary)   Resp 19   Ht _0  (1.626 m)   Wt 63 kg (138 lb 14.2 oz)   SpO2 95%   BMI 23.84 kg/m   Intake/Output Summary (Last 24 hours) at 01/18/2018 1050 Last data filed at 01/18/2018 1043 Gross per 24 hour  Intake 540 ml  Output 1600 ml  Net -1060 ml   Intake/Output: I/O last 3 completed shifts: In: 318 [P.O.:318] Out: 2000 [Urine:2000]  Intake/Output this shift:  Total I/O In: 340 [P.O.:340] Out: 900 [Urine:900] Weight change: 2 kg (4 lb 6.6 oz) Gen: NAD CVS: no rub Resp: scattered rhonchi and occ exp wheezes bilaterally Abd: Benign Ext: no edema.   Recent Labs  Lab 01/14/18 0043 01/14/18 0123 01/14/18 1834 01/15/18 0232 01/16/18 0248 01/17/18 0324 01/18/18 0242  NA 129* 129*  --  132* 134* 139 141  140  K 3.6 3.6  --  3.9 4.1 4.0 3.7  3.6  CL 97* 97*  --  102 104 106 106  104  CO2 18*  --   --  17* 18* 19* 22  22  GLUCOSE 106* 104*  --  173* 129* 82 110*  109*  BUN 56* 56*  --  71* 92* 107* 95*  95*  CREATININE 4.72* 5.00*  --  4.98* 4.95* 4.55* 3.62*  3.85*  ALBUMIN  --   --  2.4* 2.6* 2.7*  --  2.6*  CALCIUM 8.0*  --   --  8.4* 8.3* 8.1* 8.4*  8.5*  PHOS  --   --   --   --  5.6*  --  5.3*  AST  --   --  52* 49* 48*  --   --   ALT  --   --  32 34 40  --   --    Liver Function Tests: Recent Labs  Lab 01/14/18 1834 01/15/18 0232 01/16/18 0248 01/18/18 0242  AST 52* 49* 48*  --   ALT 32 34 40  --   ALKPHOS 78 77 72  --   BILITOT 0.6 0.4 0.6  --   PROT 5.8* 6.2* 5.7*  --   ALBUMIN 2.4* 2.6* 2.7* 2.6*   No results for input(s): LIPASE, AMYLASE in the last 168 hours. No results for input(s): AMMONIA in the last 168 hours. CBC: Recent Labs  Lab 01/14/18 0113  01/14/18 1011 01/15/18 0232 01/16/18 0248 01/17/18 0324 01/18/18 0242  WBC  --    < > 11.4* 14.4* 15.3* 9.7 9.7  NEUTROABS 12.0*  --   --   --   --   --    --   HGB  --    < > 10.4* 9.3* 8.7* 10.0* 9.9*  HCT  --    < > 32.1* 29.0* 27.5* 32.2* 31.7*  MCV  --    < > 75.4* 76.5* 77.5* 78.2 76.9*  PLT  --    < > 361 367 387 459* 465*   < > = values in this interval not displayed.   Cardiac Enzymes: Recent Labs  Lab 01/14/18 0635 01/16/18 0248  CKTOTAL 1,256* 442*   CBG: No results for input(s): GLUCAP in the last 168 hours.  Iron Studies: No results for input(s): IRON, TIBC, TRANSFERRIN, FERRITIN in the last 72 hours. Studies/Results: Ir US Guide Bx Asp/drain  Result  Date: 01/17/2018 INDICATION: Renal failure EXAM: ULTRASOUND-GUIDED CORE RENAL CORTEX BIOPSY MEDICATIONS: None. ANESTHESIA/SEDATION: Moderate (conscious) sedation was employed during this procedure. A total of Versed 1 mg and Fentanyl 50 mcg was administered intravenously. Moderate Sedation Time: 10 minutes. The patient's level of consciousness and vital signs were monitored continuously by radiology nursing throughout the procedure under my direct supervision. FLUOROSCOPY TIME:  Fluoroscopy Time:  minutes  seconds ( mGy). COMPLICATIONS: None immediate. PROCEDURE: Informed written consent was obtained from the patient after a thorough discussion of the procedural risks, benefits and alternatives. All questions were addressed. Maximal Sterile Barrier Technique was utilized including caps, mask, sterile gowns, sterile gloves, sterile drape, hand hygiene and skin antiseptic. A timeout was performed prior to the initiation of the procedure. 1% lidocaine was utilized for local anesthesia. Under sonographic guidance, 2 16 gauge core biopsies of the cortex of the lower pole of the right kidney were obtained. Samples were placed in saline. Final imaging was performed. The patient remains stable. FINDINGS: Images document needle placement within the cortex of the lower pole of the right kidney. Post biopsy images demonstrate no evidence of perinephric hematoma. IMPRESSION: Successful  ultrasound-guided random renal cortex core biopsy from the right kidney. Electronically Signed   By: Marybelle Killings M.D.   On: 01/17/2018 15:58   . amLODipine  10 mg Oral Daily  . levalbuterol  1.25 mg Nebulization TID  . levothyroxine  25 mcg Oral QAC breakfast  . mometasone-formoterol  2 puff Inhalation BID  . pantoprazole  40 mg Oral Q1200    BMET    Component Value Date/Time   NA 141 01/18/2018 0242   NA 140 01/18/2018 0242   K 3.7 01/18/2018 0242   K 3.6 01/18/2018 0242   CL 106 01/18/2018 0242   CL 104 01/18/2018 0242   CO2 22 01/18/2018 0242   CO2 22 01/18/2018 0242   GLUCOSE 110 (H) 01/18/2018 0242   GLUCOSE 109 (H) 01/18/2018 0242   BUN 95 (H) 01/18/2018 0242   BUN 95 (H) 01/18/2018 0242   CREATININE 3.62 (H) 01/18/2018 0242   CREATININE 3.85 (H) 01/18/2018 0242   CALCIUM 8.4 (L) 01/18/2018 0242   CALCIUM 8.5 (L) 01/18/2018 0242   GFRNONAA 11 (L) 01/18/2018 0242   GFRNONAA 10 (L) 01/18/2018 0242   GFRAA 12 (L) 01/18/2018 0242   GFRAA 12 (L) 01/18/2018 0242   CBC    Component Value Date/Time   WBC 9.7 01/18/2018 0242   RBC 4.12 01/18/2018 0242   HGB 9.9 (L) 01/18/2018 0242   HCT 31.7 (L) 01/18/2018 0242   PLT 465 (H) 01/18/2018 0242   MCV 76.9 (L) 01/18/2018 0242   MCH 24.0 (L) 01/18/2018 0242   MCHC 31.2 01/18/2018 0242   RDW 16.6 (H) 01/18/2018 0242   LYMPHSABS 1.2 01/14/2018 0113   MONOABS 0.9 01/14/2018 0113   EOSABS 0.1 01/14/2018 0113   BASOSABS 0.0 01/14/2018 0113     Assessment/Plan:  1. AKI- non-oliguric and a significant change from her baseline Cr of 0.93 on 12/14/17. She did note gross hematuria 2-3 days prior to admission but reports that it has cleared up. She does have a history of nephrolithiasis, however [place] was without evidence of obstruction or stones. Serologies pending but we did discuss the likely need for a renal biopsy as we do not have any etiology at this time. She denies any N/V/D, dysuria, pyuria, retention or urgency. No  NSAIDs or recent antibiotics. 1. Normal C3 and C4, elevated CK at  1256, ESR elevated at 40, negative hepatitis panel, negative SPEP and UPEP, ANCA, dsDNA, ASO, and ANA.urine eosinophilspending for AINas she is on a PPI.  2. S/p renalbiopsy 01/17/18 with IR. 3. Renal function continues to slowly improve. 2. Acute on chronic respiratory distress- pt with history of COPD and IVF's stopped this morning due to some SOB.some pulmonary edema seen on CXR. 1. Good diuresis with one dose of lasix without significant change in Cr but with bump in BUN. 2. Continue to follow 3. Anemia- pt with history of hematochezia. Hgb was 11 in May, GI eval per primary 4. COPD- on home O2 5. Hyponatremia- due to #1 and #2, improved after IVF's.  6. Hypothyroidism- on replacement and TSH low at 0.032 per primary 7. Disposition- will likely need SNF placement due to deconditioning   Donetta Potts, MD [facility] [phone]

## 2018-01-18 NOTE — Progress Notes (Signed)
CS[facility] for [employer] auth- auth still pending  Burna SisJenna H. Tanessa Tidd, LCSW Clinical Social Worker [phone]

## 2018-01-18 NOTE — Progress Notes (Signed)
Per Cardiac Monitoring, patient had a 4 beat run of V Tach at approximately 2145 this evening.  Patient was sleeping peacefully, BP 122/68. Heart rate noted to be 100-110.  Notified approximately 30 minutes later that the patient appeared to have converted to AFIB with a rate of 130's.  BP stable at 142/83 at 2243.  HR at 130-150s.  EKG done, MD notified, order received for Diltiazem.  Bolus given, and drip started.  After 20 minutes patient remained 130's, BP 120/80 MAP 93, Rapid team notified of change in status.

## 2018-01-18 NOTE — Progress Notes (Signed)
PROGRESS NOTE    MAKESHA Pham  ZOX:[id number] DOB: [birth year] DOA: 01/14/2018 PCP: Patient, No Pcp Per   Brief Narrative:  84 year old WF  PMHx COPD, chronic respiratory failure with hypoxia on 3 L O2 at home, Diastolic CHF, hypothyroidism, GERD   Presented to the emergency room with worsening dyspnea 1 week. She's been staying with her sister in [place] for the last 2-3 weeks, originally from [place]. -Patient also reports mild intermittent bright red blood per rectum, off and on for years couple episodes this past week. -on admission creatinine was 5, hemoglobin 10.4, chest x-ray without acute findings, no significant edema on exam. -Very poor historian, unfortunately no records in [place] -nephrology consulted, given IV fluids, subsequently developed volume overload/respiratory distress, requiring BiPAP and diuretics -IR consulted for renal biopsy      Subjective: 7/4 A/O x4, positive OB, negative CP, negative abdominal pain.   Assessment & Plan:   Principal Problem:   Acute on chronic respiratory failure with hypoxia (HCC) Active Problems:   Hyponatremia   AKI (acute kidney injury) (HCC)   Microcytic anemia   Positive fecal occult blood test   Bilateral leg pain   GIB (gastrointestinal bleeding)   Fall   COPD exacerbation (HCC)   Acute on chronic respiratory failure with hypoxia -Multifactorial COPD, CHF, acute kidney injury. -Appears [facility] have been [facility] - Currently with bilateral pleural effusion - Titrate O2 to maintain SPO2 89 to 93% - COPD/Chronic Respiratory failure with Hypoxia on 3 L O2 at home -Currently on 3 L O2: SPO2 98%  Chronic diastolic CHF (base weight?)  -Strict in and out since admission -1.3 L -Daily weight  Filed Weights   01/16/18 0500 01/17/18 0500 01/18/18 0331  Weight: 146 lb 9.7 oz (66.5 kg) 134 lb 7.7 oz (61 kg) 138 lb 14.2 oz (63 kg)  - Transfuse for hemoglobin<8 -7/4 repeat Lasix IV 60  mg.  Continue gentle diuresis.   A. fib with RVR - Patient got out of bed to sit in chair and spontaneously went into A. fib with RVR - Metoprolol IV 5 mg.  Patient did not respond - Start Cardizem drip    Acute kidney injury (baseline Cr 0.9) Recent Labs  Lab 01/14/18 0043 01/14/18 0123 01/15/18 0232 01/16/18 0248 01/17/18 0324 01/18/18 0242  CREATININE 4.72* 5.00* 4.98* 4.95* 4.55* 3.62*  3.85*  - Renal biopsy pending -Renal ultrasound negative hydronephrosis -Hold all nephrotoxins - Denies excessive NSAID use. - Nephrology following - SPEP - Polyclonal gammopathy nonspecific  Chronic intermittent hematuria hematochezia -History not suggestive of upper GI bleed - Protonix  40 mg daily -Anemia panel suggestive of iron deficiency - We will counsel patient on need for outpatient evaluation to include colonoscopy.  NOTE patient has declined colonoscopy in the past.  Anemia (baseline HgB= 11.4 in 11/2017) -Trend closely. - Occult blood pending Recent Labs  Lab 01/14/18 0123 01/14/18 0635 01/14/18 1011 01/15/18 0232 01/16/18 0248 01/17/18 0324 01/18/18 0242  HGB 10.5* 10.1* 10.4* 9.3* 8.7* 10.0* 9.9*    Hyponatremia - Most likely secondary to CHF/volume overload  - resolved  Hypomagnesmia -Magnesium goal> 2 - Magnesium IV 3 g  Hypothyroidism -TSH= 0.017: Obtain free T3  -Synthroid 25 mcg daily     DVT prophylaxis: SCD Code Status: DNR Family Communication: Sister is at bedside for discussion of plan of care Disposition Plan: TBD   Consultants:  Nephrology     Procedures/Significant Events:  6/30 echocardiogram:Left ventricle: EF= 65% to 70%.  -  Grade 1 diastolic dysfunction). - Left atrium:  moderately dilated.     I have personally reviewed and interpreted all radiology studies and my findings are as above.  VENTILATOR SETTINGS:    Cultures 6/30 respiratory virus panel negative 6/30 acute hepatitis panel negative 6/30 strep pneumo  urine antigen negative 6/30 blood NGTD     Antimicrobials: Anti-infectives (From admission, onward)   Start     Stop   01/15/18 0100  cefTRIAXone (ROCEPHIN) 1 g in sodium chloride 0.9 % 100 mL IVPB  Status:  Discontinued     01/14/18 1040   01/14/18 0145  cefTRIAXone (ROCEPHIN) 1 g in sodium chloride 0.9 % 100 mL IVPB     01/14/18 0507       Devices    LINES / TUBES:      Continuous Infusions:   Objective: Vitals:   01/17/18 2055 01/18/18 0110 01/18/18 0331 01/18/18 0725  BP:  (!) 132/42 (!) 132/42 (!) 160/61  Pulse:  82 84 90  Resp:  18 20 19   Temp: 98 F (36.7 C) 98.1 F (36.7 C) 98 F (36.7 C) 97.8 F (36.6 C)  TempSrc: Oral Oral Oral Axillary  SpO2:  97% 100% 96%  Weight:   138 lb 14.2 oz (63 kg)   Height:        Intake/Output Summary (Last 24 hours) at 01/18/2018 0729 Last data filed at 01/18/2018 [id number] Gross per 24 hour  Intake 200 ml  Output 700 ml  Net -500 ml   Filed Weights   01/16/18 0500 01/17/18 0500 01/18/18 0331  Weight: 146 lb 9.7 oz (66.5 kg) 134 lb 7.7 oz (61 kg) 138 lb 14.2 oz (63 kg)    Physical Exam:  General: A/O x4, positive chronic respiratory distress cachectic Neck:  Negative scars, masses, torticollis, lymphadenopathy, JVD Lungs: Clear to auscultation bilaterally without wheezes or crackles Cardiovascular: Irregularly irregular rhythm and rate without murmur gallop or rub normal S1 and S2 Abdomen: negative abdominal pain, nondistended, positive soft, bowel sounds, no rebound, no ascites, no appreciable mass Extremities: No significant cyanosis, clubbing, or edema bilateral lower extremities Skin: Negative rashes, lesions, ulcers Psychiatric:  Negative depression, negative anxiety, negative fatigue, negative mania  Central nervous system:  Cranial nerves II through XII intact, tongue/uvula midline, all extremities muscle strength 5/5, sensation intact throughout, negative dysarthria, negative expressive aphasia, negative  receptive aphasia.    Data Reviewed: Care during the described time interval was provided by me .  I have reviewed this patient's available data, including medical history, events of note, physical examination, and all test results as part of my evaluation.   CBC: Recent Labs  Lab 01/14/18 0113  01/14/18 1011 01/15/18 0232 01/16/18 0248 01/17/18 0324 01/18/18 0242  WBC  --    < > 11.4* 14.4* 15.3* 9.7 9.7  NEUTROABS 12.0*  --   --   --   --   --   --   HGB  --    < > 10.4* 9.3* 8.7* 10.0* 9.9*  HCT  --    < > 32.1* 29.0* 27.5* 32.2* 31.7*  MCV  --    < > 75.4* 76.5* 77.5* 78.2 76.9*  PLT  --    < > 361 367 387 459* 465*   < > = values in this interval not displayed.   Basic Metabolic Panel: Recent Labs  Lab 01/14/18 0043 01/14/18 0123 01/15/18 0232 01/16/18 0248 01/17/18 0324 01/18/18 0242  NA 129* 129* 132* 134* 139 141  140  K 3.6 3.6 3.9 4.1 4.0 3.7  3.6  CL 97* 97* 102 104 106 106  104  CO2 18*  --  17* 18* 19* 22  22  GLUCOSE 106* 104* 173* 129* 82 110*  109*  BUN 56* 56* 71* 92* 107* 95*  95*  CREATININE 4.72* 5.00* 4.98* 4.95* 4.55* 3.62*  3.85*  CALCIUM 8.0*  --  8.4* 8.3* 8.1* 8.4*  8.5*  MG  --   --   --   --   --  1.5*  PHOS  --   --   --  5.6*  --  5.3*   GFR: Estimated Creatinine Clearance: 10.2 mL/min (A) (by C-G formula based on SCr of 3.62 mg/dL (H)). Liver Function Tests: Recent Labs  Lab 01/14/18 1834 01/15/18 0232 01/16/18 0248 01/18/18 0242  AST 52* 49* 48*  --   ALT 32 34 40  --   ALKPHOS 78 77 72  --   BILITOT 0.6 0.4 0.6  --   PROT 5.8* 6.2* 5.7*  --   ALBUMIN 2.4* 2.6* 2.7* 2.6*   No results for input(s): LIPASE, AMYLASE in the last 168 hours. No results for input(s): AMMONIA in the last 168 hours. Coagulation Profile: Recent Labs  Lab 01/14/18 0635  INR 1.17   Cardiac Enzymes: Recent Labs  Lab 01/14/18 0635 01/16/18 0248  CKTOTAL 1,256* 442*   BNP (last 3 results) No results for input(s): PROBNP in the last  8760 hours. HbA1C: No results for input(s): HGBA1C in the last 72 hours. CBG: No results for input(s): GLUCAP in the last 168 hours. Lipid Profile: No results for input(s): CHOL, HDL, LDLCALC, TRIG, CHOLHDL, LDLDIRECT in the last 72 hours. Thyroid Function Tests: Recent Labs    01/16/18 1324  TSH 0.017*   Anemia Panel: No results for input(s): VITAMINB12, FOLATE, FERRITIN, TIBC, IRON, RETICCTPCT in the last 72 hours. Urine analysis:    Component Value Date/Time   COLORURINE STRAW (A) 01/14/2018 0850   APPEARANCEUR CLEAR 01/14/2018 0850   LABSPEC 1.005 01/14/2018 0850   PHURINE 6.0 01/14/2018 0850   GLUCOSEU NEGATIVE 01/14/2018 0850   HGBUR LARGE (A) 01/14/2018 0850   BILIRUBINUR NEGATIVE 01/14/2018 0850   KETONESUR NEGATIVE 01/14/2018 0850   PROTEINUR 30 (A) 01/14/2018 0850   NITRITE NEGATIVE 01/14/2018 0850   LEUKOCYTESUR SMALL (A) 01/14/2018 0850   Sepsis Labs: @LABRCNTIP (procalcitonin:4,lacticidven:4)  ) Recent Results (from the past 240 hour(s))  Respiratory Panel by PCR     Status: None   Collection Time: 01/14/18  3:03 AM  Result Value Ref Range Status   Adenovirus NOT DETECTED NOT DETECTED Final   Coronavirus 229E NOT DETECTED NOT DETECTED Final   Coronavirus HKU1 NOT DETECTED NOT DETECTED Final   Coronavirus NL63 NOT DETECTED NOT DETECTED Final   Coronavirus OC43 NOT DETECTED NOT DETECTED Final   Metapneumovirus NOT DETECTED NOT DETECTED Final   Rhinovirus / Enterovirus NOT DETECTED NOT DETECTED Final   Influenza A NOT DETECTED NOT DETECTED Final   Influenza B NOT DETECTED NOT DETECTED Final   Parainfluenza Virus 1 NOT DETECTED NOT DETECTED Final   Parainfluenza Virus 2 NOT DETECTED NOT DETECTED Final   Parainfluenza Virus 3 NOT DETECTED NOT DETECTED Final   Parainfluenza Virus 4 NOT DETECTED NOT DETECTED Final   Respiratory Syncytial Virus NOT DETECTED NOT DETECTED Final   Bordetella pertussis NOT DETECTED NOT DETECTED Final   Chlamydophila pneumoniae NOT  DETECTED NOT DETECTED Final   Mycoplasma pneumoniae NOT DETECTED NOT  DETECTED Final    Comment: Performed at [facility], [address] [place], [place] [address]  Culture, blood (x 2)     Status: None (Preliminary result)   Collection Time: 01/14/18  6:32 AM  Result Value Ref Range Status   Specimen Description BLOOD RIGHT ANTECUBITAL  Final   Special Requests   Final    BOTTLES DRAWN AEROBIC ONLY Blood Culture adequate volume   Culture   Final    NO GROWTH 3 DAYS Performed at [facility], [address], [place], [place] [address]    Report Status PENDING  Incomplete  Culture, blood (x 2)     Status: None (Preliminary result)   Collection Time: 01/14/18  6:37 AM  Result Value Ref Range Status   Specimen Description BLOOD RIGHT HAND  Final   Special Requests   Final    BOTTLES DRAWN AEROBIC ONLY Blood Culture adequate volume   Culture   Final    NO GROWTH 3 DAYS Performed at [facility], [address], [place], [place] [address]    Report Status PENDING  Incomplete         Radiology Studies: Ir US Guide Bx Asp/drain  Result Date: 01/17/2018 INDICATION: Renal failure EXAM: ULTRASOUND-GUIDED CORE RENAL CORTEX BIOPSY MEDICATIONS: None. ANESTHESIA/SEDATION: Moderate (conscious) sedation was employed during this procedure. A total of Versed 1 mg and Fentanyl 50 mcg was administered intravenously. Moderate Sedation Time: 10 minutes. The patient's level of consciousness and vital signs were monitored continuously by radiology nursing throughout the procedure under my direct supervision. FLUOROSCOPY TIME:  Fluoroscopy Time:  minutes  seconds ( mGy). COMPLICATIONS: None immediate. PROCEDURE: Informed written consent was obtained from the patient after a thorough discussion of the procedural risks, benefits and alternatives. All questions were addressed. Maximal Sterile Barrier Technique was utilized including caps, mask, sterile gowns, sterile gloves, sterile drape,  hand hygiene and skin antiseptic. A timeout was performed prior to the initiation of the procedure. 1% lidocaine was utilized for local anesthesia. Under sonographic guidance, 2 16 gauge core biopsies of the cortex of the lower pole of the right kidney were obtained. Samples were placed in saline. Final imaging was performed. The patient remains stable. FINDINGS: Images document needle placement within the cortex of the lower pole of the right kidney. Post biopsy images demonstrate no evidence of perinephric hematoma. IMPRESSION: Successful ultrasound-guided random renal cortex core biopsy from the right kidney. Electronically Signed   By: Jolaine Click M.D.   On: 01/17/2018 15:58        Scheduled Meds: . amLODipine  10 mg Oral Daily  . levalbuterol  1.25 mg Nebulization TID  . levothyroxine  25 mcg Oral QAC breakfast  . mometasone-formoterol  2 puff Inhalation BID  . pantoprazole  40 mg Oral Q1200   Continuous Infusions:   LOS: 4 days    Time spent: 40 minutes    WOODS, Roselind Messier, MD [facility] Pager [phone]   If 7PM-7AM, please contact night-coverage [email or web address] Password [email or web address] 01/18/2018, 7:29 AM

## 2018-01-19 DIAGNOSIS — J9621 Acute and chronic respiratory failure with hypoxia: Secondary | ICD-10-CM

## 2018-01-19 LAB — BASIC METABOLIC PANEL
ANION GAP: 10 (ref 5–15)
BUN: 91 mg/dL — AB (ref 8–23)
CALCIUM: 8.8 mg/dL — AB (ref 8.9–10.3)
CO2: 24 mmol/L (ref 22–32)
CREATININE: 3.73 mg/dL — AB (ref 0.44–1.00)
Chloride: 106 mmol/L (ref 98–111)
GFR calc Af Amer: 12 mL/min — ABNORMAL LOW (ref >60)
GFR calc non Af Amer: 10 mL/min — ABNORMAL LOW (ref >60)
GLUCOSE: 130 mg/dL — AB (ref 70–99)
Potassium: 3.6 mmol/L (ref 3.5–5.1)
Sodium: 140 mmol/L (ref 135–145)

## 2018-01-19 LAB — MAGNESIUM: Magnesium: 2.3 mg/dL (ref 1.7–2.4)

## 2018-01-19 LAB — CULTURE, BLOOD (ROUTINE X 2)
CULTURE: NO GROWTH
CULTURE: NO GROWTH
SPECIAL REQUESTS: ADEQUATE
Special Requests: ADEQUATE

## 2018-01-19 MED ORDER — DILTIAZEM HCL 30 MG PO TABS
30.0000 mg | ORAL_TABLET | Freq: Four times a day (QID) | ORAL | Status: DC
  Administered 2018-01-20 (×2): 30 mg via ORAL
  Filled 2018-01-19 (×2): qty 1

## 2018-01-19 MED ORDER — LEVALBUTEROL HCL 0.63 MG/3ML IN NEBU
0.6300 mg | INHALATION_SOLUTION | RESPIRATORY_TRACT | Status: DC | PRN
  Administered 2018-01-20: 0.63 mg via RESPIRATORY_TRACT
  Filled 2018-01-19: qty 3

## 2018-01-19 MED ORDER — ACETAMINOPHEN 325 MG PO TABS: 650.0000 mg | ORAL_TABLET | Freq: Four times a day (QID) | ORAL | Status: DC | PRN

## 2018-01-19 NOTE — Progress Notes (Signed)
This note also relates to the following rows which could not be included: Resp - Cannot attach notes to unvalidated device data  patient placed on BiPAP for QHS and is tolerating well.

## 2018-01-19 NOTE — Progress Notes (Signed)
S: Ms. Gonsalves developed A fib with RVR last night and HR in the 130's, she is now rate controlled on dilt drip. O:BP (!) 168/62 (BP Location: Right Wrist)   Pulse 75   Temp 98 F (36.7 C) (Oral)   Resp 19   Ht _0  (1.626 m)   Wt 57.7 kg (127 lb 3.3 oz)   SpO2 96%   BMI 21.83 kg/m   Intake/Output Summary (Last 24 hours) at 01/19/2018 1313 Last data filed at 01/19/2018 1033 Gross per 24 hour  Intake 855.74 ml  Output 1250 ml  Net -394.26 ml   Intake/Output: I/O last 3 completed shifts: In: 1055.7 [P.O.:1020; I.V.:35.7] Out: 2850 [Urine:2850]  Intake/Output this shift:  Total I/O In: 340 [P.O.:340] Out: -  Weight change: -4.2 kg (-9 lb 4.2 oz) Gen: Elderly WF in NAd CVS: no rub Resp: cta Abd: benign Ext: no edema  Recent Labs  Lab 01/14/18 0043 01/14/18 0123 01/14/18 1834 01/15/18 0232 01/16/18 0248 01/17/18 0324 01/18/18 0242 01/19/18 0336  NA 129* 129*  --  132* 134* 139 141  140 140  K 3.6 3.6  --  3.9 4.1 4.0 3.7  3.6 3.6  CL 97* 97*  --  102 104 106 106  104 106  CO2 18*  --   --  17* 18* 19* _1 GLUCOSE 106* 104*  --  173* 129* 82 110*  109* 130*  BUN 56* 56*  --  71* 92* 107* 95*  95* 91*  CREATININE 4.72* 5.00*  --  4.98* 4.95* 4.55* 3.62*  3.85* 3.73*  ALBUMIN  --   --  2.4* 2.6* 2.7*  --  2.6*  --   CALCIUM 8.0*  --   --  8.4* 8.3* 8.1* 8.4*  8.5* 8.8*  PHOS  --   --   --   --  5.6*  --  5.3*  --   AST  --   --  52* 49* 48*  --   --   --   ALT  --   --  32 34 40  --   --   --    Liver Function Tests: Recent Labs  Lab 01/14/18 1834 01/15/18 0232 01/16/18 0248 01/18/18 0242  AST 52* 49* 48*  --   ALT 32 34 40  --   ALKPHOS 78 77 72  --   BILITOT 0.6 0.4 0.6  --   PROT 5.8* 6.2* 5.7*  --   ALBUMIN 2.4* 2.6* 2.7* 2.6*   No results for input(s): LIPASE, AMYLASE in the last 168 hours. No results for input(s): AMMONIA in the last 168 hours. CBC: Recent Labs  Lab 01/14/18 0113  01/14/18 1011 01/15/18 0232 01/16/18 0248  01/17/18 0324 01/18/18 0242  WBC  --    < > 11.4* 14.4* 15.3* 9.7 9.7  NEUTROABS 12.0*  --   --   --   --   --   --   HGB  --    < > 10.4* 9.3* 8.7* 10.0* 9.9*  HCT  --    < > 32.1* 29.0* 27.5* 32.2* 31.7*  MCV  --    < > 75.4* 76.5* 77.5* 78.2 76.9*  PLT  --    < > 361 367 387 459* 465*   < > = values in this interval not displayed.   Cardiac Enzymes: Recent Labs  Lab 01/14/18 0635 01/16/18 0248  CKTOTAL 1,256* 442*   CBG: No results for  input(s): GLUCAP in the last 168 hours.  Iron Studies: No results for input(s): IRON, TIBC, TRANSFERRIN, FERRITIN in the last 72 hours. Studies/Results: Ir US Guide Bx Asp/drain  Result Date: 01/17/2018 INDICATION: Renal failure EXAM: ULTRASOUND-GUIDED CORE RENAL CORTEX BIOPSY MEDICATIONS: None. ANESTHESIA/SEDATION: Moderate (conscious) sedation was employed during this procedure. A total of Versed 1 mg and Fentanyl 50 mcg was administered intravenously. Moderate Sedation Time: 10 minutes. The patient's level of consciousness and vital signs were monitored continuously by radiology nursing throughout the procedure under my direct supervision. FLUOROSCOPY TIME:  Fluoroscopy Time:  minutes  seconds ( mGy). COMPLICATIONS: None immediate. PROCEDURE: Informed written consent was obtained from the patient after a thorough discussion of the procedural risks, benefits and alternatives. All questions were addressed. Maximal Sterile Barrier Technique was utilized including caps, mask, sterile gowns, sterile gloves, sterile drape, hand hygiene and skin antiseptic. A timeout was performed prior to the initiation of the procedure. 1% lidocaine was utilized for local anesthesia. Under sonographic guidance, 2 16 gauge core biopsies of the cortex of the lower pole of the right kidney were obtained. Samples were placed in saline. Final imaging was performed. The patient remains stable. FINDINGS: Images document needle placement within the cortex of the lower pole of the  right kidney. Post biopsy images demonstrate no evidence of perinephric hematoma. IMPRESSION: Successful ultrasound-guided random renal cortex core biopsy from the right kidney. Electronically Signed   By: Marybelle Killings M.D.   On: 01/17/2018 15:58   . levothyroxine  25 mcg Oral QAC breakfast  . mometasone-formoterol  2 puff Inhalation BID  . pantoprazole  40 mg Oral Q1200    BMET    Component Value Date/Time   NA 140 01/19/2018 0336   K 3.6 01/19/2018 0336   CL 106 01/19/2018 0336   CO2 24 01/19/2018 0336   GLUCOSE 130 (H) 01/19/2018 0336   BUN 91 (H) 01/19/2018 0336   CREATININE 3.73 (H) 01/19/2018 0336   CALCIUM 8.8 (L) 01/19/2018 0336   GFRNONAA 10 (L) 01/19/2018 0336   GFRAA 12 (L) 01/19/2018 0336   CBC    Component Value Date/Time   WBC 9.7 01/18/2018 0242   RBC 4.12 01/18/2018 0242   HGB 9.9 (L) 01/18/2018 0242   HCT 31.7 (L) 01/18/2018 0242   PLT 465 (H) 01/18/2018 0242   MCV 76.9 (L) 01/18/2018 0242   MCH 24.0 (L) 01/18/2018 0242   MCHC 31.2 01/18/2018 0242   RDW 16.6 (H) 01/18/2018 0242   LYMPHSABS 1.2 01/14/2018 0113   MONOABS 0.9 01/14/2018 0113   EOSABS 0.1 01/14/2018 0113   BASOSABS 0.0 01/14/2018 0113    Assessment/Plan:  1. AKI- non-oliguric and a significant change from her baseline Cr of 0.93 on 12/14/17. She did note gross hematuria 2-3 days prior to admission but reports that it has cleared up. She does have a history of nephrolithiasis, however [place] was without evidence of obstruction or stones. Serologies pending but we did discuss the likely need for a renal biopsy as we do not have any etiology at this time. She denies any N/V/D, dysuria, pyuria, retention or urgency. No NSAIDs or recent antibiotics. 1. Normal C3 and C4, elevated CK at 1256, ESR elevated at 40, negative hepatitis panel,negativeSPEP and UPEP,ANCA,dsDNA, ASO, andANA.urine eosinophilspending for AINas she is on a PPI.  2. S/p renalbiopsy 7/3/19with IR. 3. Renal function  continues to slowly improve. 2. A fib with RVR- now rate controlled on Dilt drip, per primary svc/cardiology 3. Acute on chronic respiratory  distress- pt with history of COPD and IVF's stopped this morning due to some SOB.some pulmonary edema seen on CXR. 1. Good diuresis with one dose of lasix without significant change in Cr but with bump in BUN. 2. Continue to follow 4. Anemia- pt with history of hematochezia. Hgb was 11 in May, GI eval per primary 5. COPD- on home O2 6. Hyponatremia- due to #1 and #2, improved after IVF's.  7. Hypothyroidism- on replacement and TSH low at 0.032 per primary 8. Disposition- will likely need SNF placement due to deconditioning   Donetta Potts, MD [facility] [phone]

## 2018-01-19 NOTE — Progress Notes (Signed)
Occupational Therapy Treatment Patient Details Name: Dana Pham MRN: [id number] DOB: Nov 25, 1931 Today's Date: 01/19/2018    History of present illness Pt is an 84 y.o. female admitted 01/14/18 with worsening dyspnea. Worked up for acute on chronic respiratory failure suspect secondary to CHF; underlying COPD. Pt also with AKI and chronic intermittent hematochezia. PMH includes HTN, COPD (3L home O2).    OT comments  Pt sitting EOB leaning on table. Encouraged pt to get to chair  Follow Up Recommendations  SNF;Supervision/Assistance - 24 hour    Equipment Recommendations  Other (comment)(to be determined at d/c venue)    Recommendations for Other Services      Precautions / Restrictions Precautions Precautions: Fall       Mobility Bed Mobility               General bed mobility comments: pt sitting EOB upon arrival  Transfers Overall transfer level: Needs assistance Equipment used: Rolling walker (2 wheeled) Transfers: Sit to/from UGI CorporationStand;Stand Pivot Transfers Sit to Stand: Min guard Stand pivot transfers: Min guard       General transfer comment: pt agreed to get to chair. RN aware.     Balance Overall balance assessment: Needs assistance Sitting-balance support: No upper extremity supported;Feet supported Sitting balance-Leahy Scale: Good     Standing balance support: During functional activity;No upper extremity supported Standing balance-Leahy Scale: Fair Standing balance comment: Can static stand and take a few steps with no UE support and min guard for balance;reliance on external UE support for ambulation and dynamic balance                           ADL either performed or assessed with clinical judgement   ADL Overall ADL's : Needs assistance/impaired Eating/Feeding: Set up;Sitting   Grooming: Set up;Sitting                   Toilet Transfer: RW;Grab bars;BSC;Min guard;Stand-pivot   Toileting- ArchitectClothing Manipulation and  Hygiene: Minimal assistance;Sit to/from stand;Cueing for safety;Cueing for sequencing       Functional mobility during ADLs: Minimal assistance;Rolling walker       Vision Patient Visual Report: No change from baseline            Cognition Arousal/Alertness: Awake/alert Behavior During Therapy: WFL for tasks assessed/performed Overall Cognitive Status: Within Functional Limits for tasks assessed                                                     Pertinent Vitals/ Pain       Pain Assessment: No/denies pain     Prior Functioning/Environment              Frequency  Min 2X/week        Progress Toward Goals  OT Goals(current goals can now be found in the care plan section)  Progress towards OT goals: Progressing toward goals     Plan Discharge plan remains appropriate    Co-evaluation                 AM-PAC PT "6 Clicks" Daily Activity     Outcome Measure   Help from another person eating meals?: None Help from another person taking care of personal grooming?: A Little Help from another person toileting, which includes using  toliet, bedpan, or urinal?: A Little Help from another person bathing (including washing, rinsing, drying)?: A Little Help from another person to put on and taking off regular upper body clothing?: A Little Help from another person to put on and taking off regular lower body clothing?: A Little 6 Click Score: 19    End of Session Equipment Utilized During Treatment: Gait belt;Rolling walker;Oxygen  OT Visit Diagnosis: Unsteadiness on feet (R26.81);Other abnormalities of gait and mobility (R26.89);Muscle weakness (generalized) (M62.81);History of falling (Z91.81)   Activity Tolerance Patient limited by fatigue   Patient Left in chair;with call bell/phone within reach;with family/visitor present   Nurse Communication Mobility status        Time: 1207-1225 OT Time Calculation (min): 18 min  Charges:  OT General Charges $OT Visit: 1 Visit OT Treatments $Self Care/Home Management : 8-22 mins  [place], [place] [phone]   Einar Crow D 01/19/2018, 1:46 PM

## 2018-01-19 NOTE — Progress Notes (Signed)
Physical Therapy Treatment Patient Details Name: Dana Pham MRN: [id number] DOB: [birth year] Today's Date: 01/19/2018    History of Present Illness Pt is an 84 y.o. female admitted 01/14/18 with worsening dyspnea. Worked up for acute on chronic respiratory failure suspect secondary to CHF; underlying COPD. Pt also with AKI and chronic intermittent hematochezia. PMH includes HTN, COPD (3L home O2). Pt converted to high rate A-fib and required IV HR control meds on 01/18/18.      PT Comments    HR and O2 sats controlled during short distance, in room mobility despite DOE 3/4 with very little activity.  Pt has to take multiple rest breaks just to complete peri care and transfer to recliner.  Gait progression may be safer with rollator or chair to follow. She remains appropriate for SNF level rehab at discharge.     Follow Up Recommendations  SNF;Supervision for mobility/OOB     Equipment Recommendations  None recommended by PT    Recommendations for Other Services   NA     Precautions / Restrictions Precautions Precautions: Fall    Mobility  Bed Mobility               General bed mobility comments: Pt sitting on BSC on arrival  Transfers Overall transfer level: Needs assistance Equipment used: 1 person hand held assist Transfers: Sit to/from Stand Sit to Stand: Min guard Stand pivot transfers: Min guard       General transfer comment: Min guard assist for safety and balance as well as line management.   Ambulation/Gait Ambulation/Gait assistance: Min guard Gait Distance (Feet): 8 Feet Assistive device: 1 person hand held assist(footboard ) Gait Pattern/deviations: Step-through pattern;Shuffle;Staggering right;Staggering left     General Gait Details: slow, shuffling gait, using end of bed and therapist's hand for support as there are too many obstacles in a tight space to use RW at this time.  Pt easlily dyspnic, HR in the 70-80s on IV HR control meds. O2 sats >90%  on O2 [place].  Pt can do short bits of activity and then needs seated rest and recovery.           Balance Overall balance assessment: Needs assistance Sitting-balance support: Feet supported;No upper extremity supported Sitting balance-Leahy Scale: Good     Standing balance support: Bilateral upper extremity supported;No upper extremity supported;Single extremity supported Standing balance-Leahy Scale: Poor Standing balance comment: Stood to preform peri care and pt using one hand for blanace and one hand to wipe.  Min guard assist from therapist.                             Cognition Arousal/Alertness: Awake/alert Behavior During Therapy: WFL for tasks assessed/performed Overall Cognitive Status: Within Functional Limits for tasks assessed                                           General Comments General comments (skin integrity, edema, etc.): Pt's two sisters in room during session.       Pertinent Vitals/Pain Pain Assessment: No/denies pain           PT Goals (current goals can now be found in the care plan section) Acute Rehab PT Goals Patient Stated Goal: to get stronger Progress towards PT goals: Progressing toward goals    Frequency    Min 2X/week  PT Plan Current plan remains appropriate       AM-PAC PT "6 Clicks" Daily Activity  Outcome Measure  Difficulty turning over in bed (including adjusting bedclothes, sheets and blankets)?: A Little Difficulty moving from lying on back to sitting on the side of the bed? : A Little Difficulty sitting down on and standing up from a chair with arms (e.g., wheelchair, bedside commode, etc,.)?: A Little Help needed moving to and from a bed to chair (including a wheelchair)?: A Little Help needed walking in hospital room?: A Little Help needed climbing 3-5 steps with a railing? : A Little 6 Click Score: 18    End of Session Equipment Utilized During Treatment: Oxygen Activity  Tolerance: Patient limited by fatigue Patient left: in chair;with call bell/phone within reach;with family/visitor present   PT Visit Diagnosis: Other abnormalities of gait and mobility (R26.89);Muscle weakness (generalized) (M62.81)     Time: 1610-9604 PT Time Calculation (min) (ACUTE ONLY): 18 min  Charges:  $Therapeutic Activity: 8-22 mins          Trenton Passow B. Arnie Maiolo, PT, DPT [phone]            01/19/2018, 2:16 PM

## 2018-01-19 NOTE — Progress Notes (Addendum)
[place] TEAM 1 - Stepdown/ICU TEAM  Dana Pham  ZOX:[id number] DOB: 10-May-1932 DOA: 01/14/2018 PCP: Patient, No Pcp Per    Brief Narrative:  84yo F w/ a Hx COPD, chronic respiratory failure with hypoxia on 3 L O2 at home, Diastolic CHF, hypothyroidism, and GERD who presented to the ED with worsening dyspnea 1 week. On admission creatinine was 5, hemoglobin 10.4, chest x-ray without acute findings, no significant edema on exam.  Significant Events: 6/30 admit 7/3 renal bx  7/4 converted to Afib 7/5 early AM - converted to NSR   Subjective: Pt states she is feeling better in general but is not yet back to her baseline.  She denies chest pain nausea vomiting or abdominal pain.  PT/OT have suggested that she needs a SNF stay after discharge.  Assessment & Plan:  Acute on chronic respiratory failure with hypoxia Multifactorial:   COPD, CHF, acute kidney injury - much improved with minimal O2 support necessary at this time  COPD / Chronic Hypoxic Respiratory failure on 3 L O2 at home Has returned to home oxygen dose  Acute kidney injury baseline Cr 0.9 - etiology is unclear - no hydro on renal US - s/p renal bx 7/3 in IR - creatinine appears to be slowly improving at this time  Recent Labs  Lab 01/15/18 0232 01/16/18 0248 01/17/18 0324 01/18/18 0242 01/19/18 0336  CREATININE 4.98* 4.95* 4.55* 3.62*  3.85* 3.73*    Chronic grade 1 diastolic CHF  No significant volume overload on physical exam Filed Weights   01/18/18 0331 01/19/18 0346 01/19/18 0400  Weight: 63 kg (138 lb 14.2 oz) 58.8 kg (129 lb 10.1 oz) 57.7 kg (127 lb 3.3 oz)    Parox A. fib with RVR Converted back to NSR over night - follow for now - not a candidate for anticoag at this time due to possible bleeding   Chronic intermittent hematuria / hematochezia - chronic anemia  Anemia panel c/w ACD as well as an Fe deficient state - ferritin NOT c/w anemia of blood loss - counsel patient on need for  outpatient evaluation to include colonoscopy (has declined colonoscopy in the past) - baseline Hgb 11.4 in 11/2017  Hypothyroidism TSH very low at 0.032 - check FT3 and FT4  DVT prophylaxis: SCDs Code Status: DNR - NO CODE Family Communication: no family present at time of exam  Disposition Plan:   Consultants:  Nephrology   Antimicrobials:  none   Objective: Blood pressure (!) 167/48, pulse 72, temperature 97.7 F (36.5 C), temperature source Axillary, resp. rate 19, height 5\' 4"  (1.626 m), weight 57.7 kg (127 lb 3.3 oz), SpO2 98 %.  Intake/Output Summary (Last 24 hours) at 01/19/2018 1140 Last data filed at 01/19/2018 1033 Gross per 24 hour  Intake 855.74 ml  Output 1250 ml  Net -394.26 ml   Filed Weights   01/18/18 0331 01/19/18 0346 01/19/18 0400  Weight: 63 kg (138 lb 14.2 oz) 58.8 kg (129 lb 10.1 oz) 57.7 kg (127 lb 3.3 oz)    Examination: General: No acute respiratory distress Lungs: Clear to auscultation bilaterally without wheezes or crackles Cardiovascular: Regular rate and rhythm without murmur gallop or rub normal S1 and S2 Abdomen: Nontender, nondistended, soft, bowel sounds positive, no rebound, no ascites, no appreciable mass Extremities: No significant cyanosis, clubbing, or edema bilateral lower extremities  CBC: Recent Labs  Lab 01/14/18 0113  01/16/18 0248 01/17/18 0324 01/18/18 0242  WBC  --    < > 15.3*  9.7 9.7  NEUTROABS 12.0*  --   --   --   --   HGB  --    < > 8.7* 10.0* 9.9*  HCT  --    < > 27.5* 32.2* 31.7*  MCV  --    < > 77.5* 78.2 76.9*  PLT  --    < > 387 459* 465*   < > = values in this interval not displayed.   Basic Metabolic Panel: Recent Labs  Lab 01/16/18 0248 01/17/18 0324 01/18/18 0242 01/19/18 0336  NA 134* 139 141  140 140  K 4.1 4.0 3.7  3.6 3.6  CL 104 106 106  104 106  CO2 18* 19* 22  22 24   GLUCOSE 129* 82 110*  109* 130*  BUN 92* 107* 95*  95* 91*  CREATININE 4.95* 4.55* 3.62*  3.85* 3.73*  CALCIUM  8.3* 8.1* 8.4*  8.5* 8.8*  MG  --   --  1.5* 2.3  PHOS 5.6*  --  5.3*  --    GFR: Estimated Creatinine Clearance: 9.9 mL/min (A) (by C-G formula based on SCr of 3.73 mg/dL (H)).  Liver Function Tests: Recent Labs  Lab 01/14/18 1834 01/15/18 0232 01/16/18 0248 01/18/18 0242  AST 52* 49* 48*  --   ALT 32 34 40  --   ALKPHOS 78 77 72  --   BILITOT 0.6 0.4 0.6  --   PROT 5.8* 6.2* 5.7*  --   ALBUMIN 2.4* 2.6* 2.7* 2.6*    Coagulation Profile: Recent Labs  Lab 01/14/18 0635  INR 1.17    Cardiac Enzymes: Recent Labs  Lab 01/14/18 0635 01/16/18 0248  CKTOTAL 1,256* 442*    HbA1C: Hgb A1c MFr Bld  Date/Time Value Ref Range Status  01/14/2018 10:58 AM 5.9 (H) 4.8 - 5.6 % Final    Comment:    (NOTE) Pre diabetes:          5.7%-6.4% Diabetes:              >6.4% Glycemic control for   <7.0% adults with diabetes     Recent Results (from the past 240 hour(s))  Respiratory Panel by PCR     Status: None   Collection Time: 01/14/18  3:03 AM  Result Value Ref Range Status   Adenovirus NOT DETECTED NOT DETECTED Final   Coronavirus 229E NOT DETECTED NOT DETECTED Final   Coronavirus HKU1 NOT DETECTED NOT DETECTED Final   Coronavirus NL63 NOT DETECTED NOT DETECTED Final   Coronavirus OC43 NOT DETECTED NOT DETECTED Final   Metapneumovirus NOT DETECTED NOT DETECTED Final   Rhinovirus / Enterovirus NOT DETECTED NOT DETECTED Final   Influenza A NOT DETECTED NOT DETECTED Final   Influenza B NOT DETECTED NOT DETECTED Final   Parainfluenza Virus 1 NOT DETECTED NOT DETECTED Final   Parainfluenza Virus 2 NOT DETECTED NOT DETECTED Final   Parainfluenza Virus 3 NOT DETECTED NOT DETECTED Final   Parainfluenza Virus 4 NOT DETECTED NOT DETECTED Final   Respiratory Syncytial Virus NOT DETECTED NOT DETECTED Final   Bordetella pertussis NOT DETECTED NOT DETECTED Final   Chlamydophila pneumoniae NOT DETECTED NOT DETECTED Final   Mycoplasma pneumoniae NOT DETECTED NOT DETECTED Final     Comment: Performed at [facility], [address] [place], [place] [address]  Culture, blood (x 2)     Status: None   Collection Time: 01/14/18  6:32 AM  Result Value Ref Range Status   Specimen Description BLOOD  RIGHT ANTECUBITAL  Final   Special Requests   Final    BOTTLES DRAWN AEROBIC ONLY Blood Culture adequate volume   Culture   Final    NO GROWTH 5 DAYS Performed at [facility], [address], [place], [place] [address]    Report Status 01/19/2018 FINAL  Final  Culture, blood (x 2)     Status: None   Collection Time: 01/14/18  6:37 AM  Result Value Ref Range Status   Specimen Description BLOOD RIGHT HAND  Final   Special Requests   Final    BOTTLES DRAWN AEROBIC ONLY Blood Culture adequate volume   Culture   Final    NO GROWTH 5 DAYS Performed at [facility], [address] [place], [place] [address]    Report Status 01/19/2018 FINAL  Final     Scheduled Meds: . levothyroxine  25 mcg Oral QAC breakfast  . mometasone-formoterol  2 puff Inhalation BID  . pantoprazole  40 mg Oral Q1200   Continuous Infusions: . diltiazem (CARDIZEM) infusion 5 mg/hr (01/19/18 0038)     LOS: 5 days   Lonia Blood, MD [facility] Office  [phone] Pager - Text Page per Loretha Stapler as per below:  On-Call/Text Page:      [email or web address]      password [email or web address]  If 7PM-7AM, please contact night-coverage [email or web address] Password [email or web address] 01/19/2018, 11:40 AM

## 2018-01-19 NOTE — Progress Notes (Signed)
Patient noted to be in SR, EKG confirmed, rate in 70's.  Diltiazem had been titrated after 20 minutes initially to 10 mg/ hr.  With rate conversion, Diltiazem rate titrated to 5 mg/ hr. Dr. Haydee SalterBodenhimer paged and advised to maintain rate of 5 mg/hr as long as heart rate and blood pressure remain stable until morning.  Patient now on BIPAP due to becoming fatigued with breathing.

## 2018-01-19 NOTE — Progress Notes (Signed)
Placed patient on NIV at this time, no distress noted patient tolerating well, RCP will continue to follow.

## 2018-01-19 NOTE — Progress Notes (Signed)
Patient has converted back to NSR overnight and has remained there with rates in the 70's to low 80's.  She has had 2 episodes of 3 to 4 beat VT without symptoms overnight. Patient was on bipap for approximately 4 hours. Diltiazem remains at 5 mg / hr with orders not to discontinue until orders received.  BP has remained stable throughout the night.

## 2018-01-20 LAB — COMPREHENSIVE METABOLIC PANEL WITH GFR
ALT: 24 U/L (ref 0–44)
AST: 14 U/L — ABNORMAL LOW (ref 15–41)
Albumin: 2.6 g/dL — ABNORMAL LOW (ref 3.5–5.0)
Alkaline Phosphatase: 65 U/L (ref 38–126)
Anion gap: 10 (ref 5–15)
BUN: 88 mg/dL — ABNORMAL HIGH (ref 8–23)
CO2: 23 mmol/L (ref 22–32)
Calcium: 8.8 mg/dL — ABNORMAL LOW (ref 8.9–10.3)
Chloride: 105 mmol/L (ref 98–111)
Creatinine, Ser: 3.34 mg/dL — ABNORMAL HIGH (ref 0.44–1.00)
GFR calc Af Amer: 14 mL/min — ABNORMAL LOW
GFR calc non Af Amer: 12 mL/min — ABNORMAL LOW
Glucose, Bld: 146 mg/dL — ABNORMAL HIGH (ref 70–99)
Potassium: 3.7 mmol/L (ref 3.5–5.1)
Sodium: 138 mmol/L (ref 135–145)
Total Bilirubin: 0.6 mg/dL (ref 0.3–1.2)
Total Protein: 5.7 g/dL — ABNORMAL LOW (ref 6.5–8.1)

## 2018-01-20 LAB — CBC
HCT: 31.7 % — ABNORMAL LOW (ref 36.0–46.0)
HEMOGLOBIN: 9.9 g/dL — AB (ref 12.0–15.0)
MCH: 24.5 pg — ABNORMAL LOW (ref 26.0–34.0)
MCHC: 31.2 g/dL (ref 30.0–36.0)
MCV: 78.5 fL (ref 78.0–100.0)
Platelets: 441 10*3/uL — ABNORMAL HIGH (ref 150–400)
RBC: 4.04 MIL/uL (ref 3.87–5.11)
RDW: 16.6 % — ABNORMAL HIGH (ref 11.5–15.5)
WBC: 12.5 10*3/uL — ABNORMAL HIGH (ref 4.0–10.5)

## 2018-01-20 LAB — T3, FREE: T3, Free: 2 pg/mL (ref 2.0–4.4)

## 2018-01-20 MED ORDER — METOPROLOL TARTRATE 5 MG/5ML IV SOLN
10.0000 mg | Freq: Once | INTRAVENOUS | Status: DC
  Administered 2018-01-20: 10 mg via INTRAVENOUS

## 2018-01-20 MED ORDER — DILTIAZEM HCL 60 MG PO TABS
60.0000 mg | ORAL_TABLET | Freq: Four times a day (QID) | ORAL | Status: AC
  Administered 2018-01-20 – 2018-01-21 (×6): 60 mg via ORAL
  Filled 2018-01-20 (×6): qty 1

## 2018-01-20 MED ORDER — METOPROLOL TARTRATE 5 MG/5ML IV SOLN
INTRAVENOUS | Status: AC
  Administered 2018-01-20: 10 mg via INTRAVENOUS
  Filled 2018-01-20: qty 5

## 2018-01-20 MED ORDER — METOPROLOL TARTRATE 5 MG/5ML IV SOLN
INTRAVENOUS | Status: AC
  Filled 2018-01-20: qty 5

## 2018-01-20 MED ORDER — TIOTROPIUM BROMIDE MONOHYDRATE 18 MCG IN CAPS
18.0000 ug | ORAL_CAPSULE | Freq: Every day | RESPIRATORY_TRACT | Status: DC
  Administered 2018-01-20 – 2018-01-22 (×3): 18 ug via RESPIRATORY_TRACT
  Filled 2018-01-20: qty 5

## 2018-01-20 NOTE — Plan of Care (Signed)
Discussed plan of care with patient.  Emphasized the importance of using her call bell before exiting the bed and using her oxygen at all times.  Patient displayed good teach back.

## 2018-01-20 NOTE — Progress Notes (Signed)
S: Feels weak but otherwise good O:BP (!) 150/60 (BP Location: Right Arm)   Pulse (!) 55   Temp 97.7 F (36.5 C) (Oral)   Resp (!) 22   Ht 5' 4"  (1.626 m)   Wt 57.8 kg (127 lb 6.4 oz)   SpO2 99%   BMI 21.87 kg/m  No intake or output data in the 24 hours ending 01/20/18 1147 Intake/Output: I/O last 3 completed shifts: In: 855.7 [P.O.:820; I.V.:35.7] Out: 1250 [Urine:1250]  Intake/Output this shift:  No intake/output data recorded. Weight change: -1.012 kg (-2 lb 3.7 oz) Gen: frail elderly WF in NAD CVS: bradycardic, no rub Resp: scattered rhonchi Abd: benign Ext: no edema  Recent Labs  Lab 01/14/18 0043 01/14/18 0123 01/14/18 1834 01/15/18 0232 01/16/18 0248 01/17/18 0324 01/18/18 0242 01/19/18 0336 01/20/18 0331  NA 129* 129*  --  132* 134* 139 141  140 140 138  K 3.6 3.6  --  3.9 4.1 4.0 3.7  3.6 3.6 3.7  CL 97* 97*  --  102 104 106 106  104 106 105  CO2 18*  --   --  17* 18* 19* 22  22 24 23   GLUCOSE 106* 104*  --  173* 129* 82 110*  109* 130* 146*  BUN 56* 56*  --  71* 92* 107* 95*  95* 91* 88*  CREATININE 4.72* 5.00*  --  4.98* 4.95* 4.55* 3.62*  3.85* 3.73* 3.34*  ALBUMIN  --   --  2.4* 2.6* 2.7*  --  2.6*  --  2.6*  CALCIUM 8.0*  --   --  8.4* 8.3* 8.1* 8.4*  8.5* 8.8* 8.8*  PHOS  --   --   --   --  5.6*  --  5.3*  --   --   AST  --   --  52* 49* 48*  --   --   --  14*  ALT  --   --  32 34 40  --   --   --  24   Liver Function Tests: Recent Labs  Lab 01/15/18 0232 01/16/18 0248 01/18/18 0242 01/20/18 0331  AST 49* 48*  --  14*  ALT 34 40  --  24  ALKPHOS 77 72  --  65  BILITOT 0.4 0.6  --  0.6  PROT 6.2* 5.7*  --  5.7*  ALBUMIN 2.6* 2.7* 2.6* 2.6*   No results for input(s): LIPASE, AMYLASE in the last 168 hours. No results for input(s): AMMONIA in the last 168 hours. CBC: Recent Labs  Lab 01/14/18 0113  01/15/18 0232 01/16/18 0248 01/17/18 0324 01/18/18 0242 01/20/18 0331  WBC  --    < > 14.4* 15.3* 9.7 9.7 12.5*  NEUTROABS  12.0*  --   --   --   --   --   --   HGB  --    < > 9.3* 8.7* 10.0* 9.9* 9.9*  HCT  --    < > 29.0* 27.5* 32.2* 31.7* 31.7*  MCV  --    < > 76.5* 77.5* 78.2 76.9* 78.5  PLT  --    < > 367 387 459* 465* 441*   < > = values in this interval not displayed.   Cardiac Enzymes: Recent Labs  Lab 01/14/18 0635 01/16/18 0248  CKTOTAL 1,256* 442*   CBG: No results for input(s): GLUCAP in the last 168 hours.  Iron Studies: No results for input(s): IRON, TIBC, TRANSFERRIN, FERRITIN in the last  72 hours. Studies/Results: No results found. . diltiazem  60 mg Oral Q6H  . metoprolol tartrate      . mometasone-formoterol  2 puff Inhalation BID  . pantoprazole  40 mg Oral Q1200  . tiotropium  18 mcg Inhalation Daily    BMET    Component Value Date/Time   NA 138 01/20/2018 0331   K 3.7 01/20/2018 0331   CL 105 01/20/2018 0331   CO2 23 01/20/2018 0331   GLUCOSE 146 (H) 01/20/2018 0331   BUN 88 (H) 01/20/2018 0331   CREATININE 3.34 (H) 01/20/2018 0331   CALCIUM 8.8 (L) 01/20/2018 0331   GFRNONAA 12 (L) 01/20/2018 0331   GFRAA 14 (L) 01/20/2018 0331   CBC    Component Value Date/Time   WBC 12.5 (H) 01/20/2018 0331   RBC 4.04 01/20/2018 0331   HGB 9.9 (L) 01/20/2018 0331   HCT 31.7 (L) 01/20/2018 0331   PLT 441 (H) 01/20/2018 0331   MCV 78.5 01/20/2018 0331   MCH 24.5 (L) 01/20/2018 0331   MCHC 31.2 01/20/2018 0331   RDW 16.6 (H) 01/20/2018 0331   LYMPHSABS 1.2 01/14/2018 0113   MONOABS 0.9 01/14/2018 0113   EOSABS 0.1 01/14/2018 0113   BASOSABS 0.0 01/14/2018 0113    Assessment/Plan:  1. AKI- non-oliguric and a significant change from her baseline Cr of 0.93 on 12/14/17. She did note gross hematuria 2-3 days prior to admission but reports that it has cleared up. She does have a history of nephrolithiasis, however [place] was without evidence of obstruction or stones. She denies any N/V/D, dysuria, pyuria, retention or urgency. No NSAIDs or recent antibiotics. 1. Normal C3 and  C4, elevated CK at 1256, ESR elevated at 40, negative hepatitis panel,negativeSPEP and UPEP,ANCA,dsDNA, ASO, andANA.urine eosinophilspending for AINas she is on a PPI.  2. S/prenalbiopsy7/3/19with IR, hopefully will get preliminary on Monday 3. Renal function continues to slowly improve. 2. A fib with RVR- now rate controlled on Dilt and metoprolol po, per primary svc 3. Acute on chronic respiratory distress- pt with history of COPD and IVF's stopped this morning due to some SOB.some pulmonary edema seen on CXR. 1. Good diuresis with one dose of lasix without significant change in Cr but with bump in BUN. 2. Continue to follow 4. Anemia- pt with history of hematochezia. Hgb was 11 in May, GI eval per primary 5. COPD- on home O2 6. Hyponatremia- due to #1 and #2, improved after IVF's.  7. Hypothyroidism- on replacement and TSH low at 0.032 per primary 8. Disposition- will likely need SNF placement due to deconditioning    Donetta Potts, MD [facility] [phone]

## 2018-01-20 NOTE — Progress Notes (Signed)
[place] TEAM 1 - Stepdown/ICU TEAM  Dana Pham  ZOX:[id number] DOB: 20-Sep-1931 DOA: 01/14/2018 PCP: Patient, No Pcp Per    Brief Narrative:  84yo F w/ a Hx COPD, chronic respiratory failure with hypoxia on 3 L O2 at home, Diastolic CHF, hypothyroidism, and GERD who presented to the ED with worsening dyspnea 1 week. On admission creatinine was 5, hemoglobin 10.4, chest x-ray without acute findings, no significant edema on exam.  Significant Events: 6/30 admit 7/3 renal bx  7/4 converted to Afib 7/5 early AM - converted to NSR   Subjective: Sitting up in a bedside chair.  No new complaints.  Tells me she is willing to got to a SNF for a rehab stay, and hopes to improve to the point of living in an ALF.    Assessment & Plan:  Acute on chronic respiratory failure with hypoxia Multifactorial:   COPD, CHF, acute kidney injury - much improved with minimal O2 support necessary at this time  COPD / Chronic Hypoxic Respiratory failure on 3 L O2 at home Has returned to home oxygen dose  Acute kidney injury baseline Cr 0.9 - etiology is unclear - no hydro on renal US - s/p renal bx 7/3 in IR - creatinine appears to be slowly improving at this time  Recent Labs  Lab 01/16/18 0248 01/17/18 0324 01/18/18 0242 01/19/18 0336 01/20/18 0331  CREATININE 4.95* 4.55* 3.62*  3.85* 3.73* 3.34*    Chronic grade 1 diastolic CHF  No significant volume overload on physical exam Filed Weights   01/19/18 0346 01/19/18 0400 01/20/18 0500  Weight: 58.8 kg (129 lb 10.1 oz) 57.7 kg (127 lb 3.3 oz) 57.8 kg (127 lb 6.4 oz)    Parox A. fib with RVR Has been in and out of Afib over last 24hrs - follow for now - not a candidate for anticoag at this time due to possible bleeding   Chronic intermittent hematuria / hematochezia - chronic anemia  Anemia panel c/w ACD as well as an Fe deficient state - ferritin NOT c/w anemia of blood loss - counsel patient on need for outpatient evaluation to  include colonoscopy (has declined colonoscopy in the past) - baseline Hgb 11.4 in 11/2017  Hypothyroidism TSH very low at 0.032 - FT3 is normal - check FT4  DVT prophylaxis: SCDs Code Status: DNR - NO CODE Family Communication: spoke w/ sister at bedside  Disposition Plan: for SNF early next week   Consultants:  Nephrology   Antimicrobials:  none   Objective: Blood pressure (!) 153/58, pulse 61, temperature 97.8 F (36.6 C), temperature source Oral, resp. rate 20, height 5\' 4"  (1.626 m), weight 57.8 kg (127 lb 6.4 oz), SpO2 97 %. No intake or output data in the 24 hours ending 01/20/18 1720 Filed Weights   01/19/18 0346 01/19/18 0400 01/20/18 0500  Weight: 58.8 kg (129 lb 10.1 oz) 57.7 kg (127 lb 3.3 oz) 57.8 kg (127 lb 6.4 oz)    Examination: General: No acute respiratory distress Lungs: CTA B - no wheezing  Cardiovascular: RRR w/o M  Abdomen: NT/ND, soft, bs+, no mass  Extremities: trace B LE edema   CBC: Recent Labs  Lab 01/14/18 0113  01/17/18 0324 01/18/18 0242 01/20/18 0331  WBC  --    < > 9.7 9.7 12.5*  NEUTROABS 12.0*  --   --   --   --   HGB  --    < > 10.0* 9.9* 9.9*  HCT  --    < >  32.2* 31.7* 31.7*  MCV  --    < > 78.2 76.9* 78.5  PLT  --    < > 459* 465* 441*   < > = values in this interval not displayed.   Basic Metabolic Panel: Recent Labs  Lab 01/16/18 0248  01/18/18 0242 01/19/18 0336 01/20/18 0331  NA 134*   < > 141  140 140 138  K 4.1   < > 3.7  3.6 3.6 3.7  CL 104   < > 106  104 106 105  CO2 18*   < > 22  22 24 23   GLUCOSE 129*   < > 110*  109* 130* 146*  BUN 92*   < > 95*  95* 91* 88*  CREATININE 4.95*   < > 3.62*  3.85* 3.73* 3.34*  CALCIUM 8.3*   < > 8.4*  8.5* 8.8* 8.8*  MG  --   --  1.5* 2.3  --   PHOS 5.6*  --  5.3*  --   --    < > = values in this interval not displayed.   GFR: Estimated Creatinine Clearance: 11 mL/min (A) (by C-G formula based on SCr of 3.34 mg/dL (H)).  Liver Function Tests: Recent Labs  Lab  01/14/18 1834 01/15/18 0232 01/16/18 0248 01/18/18 0242 01/20/18 0331  AST 52* 49* 48*  --  14*  ALT 32 34 40  --  24  ALKPHOS 78 77 72  --  65  BILITOT 0.6 0.4 0.6  --  0.6  PROT 5.8* 6.2* 5.7*  --  5.7*  ALBUMIN 2.4* 2.6* 2.7* 2.6* 2.6*    Coagulation Profile: Recent Labs  Lab 01/14/18 0635  INR 1.17    Cardiac Enzymes: Recent Labs  Lab 01/14/18 0635 01/16/18 0248  CKTOTAL 1,256* 442*    HbA1C: Hgb A1c MFr Bld  Date/Time Value Ref Range Status  01/14/2018 10:58 AM 5.9 (H) 4.8 - 5.6 % Final    Comment:    (NOTE) Pre diabetes:          5.7%-6.4% Diabetes:              >6.4% Glycemic control for   <7.0% adults with diabetes     Recent Results (from the past 240 hour(s))  Respiratory Panel by PCR     Status: None   Collection Time: 01/14/18  3:03 AM  Result Value Ref Range Status   Adenovirus NOT DETECTED NOT DETECTED Final   Coronavirus 229E NOT DETECTED NOT DETECTED Final   Coronavirus HKU1 NOT DETECTED NOT DETECTED Final   Coronavirus NL63 NOT DETECTED NOT DETECTED Final   Coronavirus OC43 NOT DETECTED NOT DETECTED Final   Metapneumovirus NOT DETECTED NOT DETECTED Final   Rhinovirus / Enterovirus NOT DETECTED NOT DETECTED Final   Influenza A NOT DETECTED NOT DETECTED Final   Influenza B NOT DETECTED NOT DETECTED Final   Parainfluenza Virus 1 NOT DETECTED NOT DETECTED Final   Parainfluenza Virus 2 NOT DETECTED NOT DETECTED Final   Parainfluenza Virus 3 NOT DETECTED NOT DETECTED Final   Parainfluenza Virus 4 NOT DETECTED NOT DETECTED Final   Respiratory Syncytial Virus NOT DETECTED NOT DETECTED Final   Bordetella pertussis NOT DETECTED NOT DETECTED Final   Chlamydophila pneumoniae NOT DETECTED NOT DETECTED Final   Mycoplasma pneumoniae NOT DETECTED NOT DETECTED Final    Comment: Performed at [facility], [address] [place], [place] [address]  Culture, blood (x 2)     Status: None  Collection Time: 01/14/18  6:32 AM  Result Value Ref Range  Status   Specimen Description BLOOD RIGHT ANTECUBITAL  Final   Special Requests   Final    BOTTLES DRAWN AEROBIC ONLY Blood Culture adequate volume   Culture   Final    NO GROWTH 5 DAYS Performed at [facility], [address] [place], [place] [address]    Report Status 01/19/2018 FINAL  Final  Culture, blood (x 2)     Status: None   Collection Time: 01/14/18  6:37 AM  Result Value Ref Range Status   Specimen Description BLOOD RIGHT HAND  Final   Special Requests   Final    BOTTLES DRAWN AEROBIC ONLY Blood Culture adequate volume   Culture   Final    NO GROWTH 5 DAYS Performed at [facility], [address] [place], [place] [address]    Report Status 01/19/2018 FINAL  Final     Scheduled Meds: . diltiazem  60 mg Oral Q6H  . metoprolol tartrate      . mometasone-formoterol  2 puff Inhalation BID  . pantoprazole  40 mg Oral Q1200  . tiotropium  18 mcg Inhalation Daily       LOS: 6 days   Lonia BloodJeffrey T. Adriona Kaney, MD [facility] Office  [phone] Pager - Text Page per Amion as per below:  On-Call/Text Page:      [email or web address]      password [email or web address]  If 7PM-7AM, please contact night-coverage [email or web address] Password [email or web address] 01/20/2018, 5:20 PM

## 2018-01-21 LAB — CBC
HCT: 31.3 % — ABNORMAL LOW (ref 36.0–46.0)
Hemoglobin: 9.8 g/dL — ABNORMAL LOW (ref 12.0–15.0)
MCH: 24.3 pg — AB (ref 26.0–34.0)
MCHC: 31.3 g/dL (ref 30.0–36.0)
MCV: 77.7 fL — ABNORMAL LOW (ref 78.0–100.0)
PLATELETS: 441 10*3/uL — AB (ref 150–400)
RBC: 4.03 MIL/uL (ref 3.87–5.11)
RDW: 16.3 % — ABNORMAL HIGH (ref 11.5–15.5)
WBC: 13.7 10*3/uL — ABNORMAL HIGH (ref 4.0–10.5)

## 2018-01-21 LAB — RENAL FUNCTION PANEL
ALBUMIN: 2.6 g/dL — AB (ref 3.5–5.0)
Anion gap: 10 (ref 5–15)
BUN: 85 mg/dL — AB (ref 8–23)
CALCIUM: 8.8 mg/dL — AB (ref 8.9–10.3)
CO2: 23 mmol/L (ref 22–32)
CREATININE: 3.38 mg/dL — AB (ref 0.44–1.00)
Chloride: 104 mmol/L (ref 98–111)
GFR calc Af Amer: 13 mL/min — ABNORMAL LOW (ref >60)
GFR calc non Af Amer: 12 mL/min — ABNORMAL LOW (ref >60)
Glucose, Bld: 113 mg/dL — ABNORMAL HIGH (ref 70–99)
PHOSPHORUS: 4.6 mg/dL (ref 2.5–4.6)
Potassium: 4.1 mmol/L (ref 3.5–5.1)
SODIUM: 137 mmol/L (ref 135–145)

## 2018-01-21 LAB — T4, FREE: Free T4: 1.91 ng/dL — ABNORMAL HIGH (ref 0.82–1.77)

## 2018-01-21 MED ORDER — DILTIAZEM HCL ER COATED BEADS 240 MG PO CP24
240.0000 mg | ORAL_CAPSULE | Freq: Every day | ORAL | Status: DC
  Administered 2018-01-22: 240 mg via ORAL
  Filled 2018-01-21: qty 1

## 2018-01-21 NOTE — Progress Notes (Signed)
Pt declined use of BIPAP at this time- machine at bedside on stand-by for use.

## 2018-01-21 NOTE — Plan of Care (Signed)
Discussed plan of care with patient.  Encouraged patient to ambulate more.  Patient sat in the recliner for part of the day and now uses the [facility] instead of the [facility].  Patient displayed good teach back.

## 2018-01-21 NOTE — Progress Notes (Signed)
[place] [facility] TEAM 1 - Stepdown/ICU TEAM  LAURAJEAN HOSEK  BJY:[id number] DOB: April 08, 1932 DOA: 01/14/2018 PCP: Patient, No Pcp Per    Brief Narrative:  84yo F w/ a Hx COPD, chronic respiratory failure with hypoxia on 3 L O2 at home, Diastolic CHF, hypothyroidism, and GERD who presented to the ED with worsening dyspnea 1 week. On admission creatinine was 5, hemoglobin 10.4, chest x-ray without acute findings, no significant edema on exam.  Significant Events: 6/30 admit 7/3 renal bx  7/4 converted to Afib 7/5 early AM - converted to NSR   Subjective: Resting comfortably in a bedside chair.  No new complaints.  Reports that she is ready to be transitioned to a rehabilitation facility.  Assessment & Plan:  Acute on chronic respiratory failure with hypoxia Multifactorial:   COPD, CHF, acute kidney injury - much improved with minimal O2 support necessary at this time  COPD / Chronic Hypoxic Respiratory failure on 3 L O2 at home Has returned to home oxygen dose and is stable   Acute kidney injury baseline Cr 0.9 - etiology is unclear - no hydro on renal US - s/p renal bx 7/3 in IR - care per Nephrology   Recent Labs  Lab 01/17/18 0324 01/18/18 0242 01/19/18 0336 01/20/18 0331 01/21/18 0341  CREATININE 4.55* 3.62*  3.85* 3.73* 3.34* 3.38*    Chronic grade 1 diastolic CHF  No significant volume overload on physical exam - weight is stable   Filed Weights   01/19/18 0400 01/20/18 0500 01/21/18 0865  Weight: 57.7 kg (127 lb 3.3 oz) 57.8 kg (127 lb 6.4 oz) 57.7 kg (127 lb 4.8 oz)    Parox A. fib with RVR Has been in and out of Afib during this hospital stay - not a candidate for anticoag at this time due to possible bleeding at presentation as well as high fall risk   Chronic intermittent hematuria / hematochezia - chronic anemia  Anemia panel c/w ACD as well as an Fe deficient state - ferritin NOT c/w anemia of blood loss - counsel patient on need for outpatient evaluation to  include colonoscopy (has declined colonoscopy in the past) - baseline Hgb 11.4 in 11/2017  Hypothyroidism TSH very low at 0.032 - FT3 is normal - FT4 essentially normal - in setting of Afib synthroid has been stopped - will need repeat TSH in 6-8 weeks   DVT prophylaxis: SCDs Code Status: DNR - NO CODE Family Communication: spoke w/ sister at bedside  Disposition Plan: for SNF as soon as bed identified   Consultants:  Nephrology   Antimicrobials:  none   Objective: Blood pressure (!) 145/52, pulse 72, temperature 98.2 F (36.8 C), temperature source Oral, resp. rate 18, height 5\' 4"  (1.626 m), weight 57.7 kg (127 lb 4.8 oz), SpO2 100 %. No intake or output data in the 24 hours ending 01/21/18 1326 Filed Weights   01/19/18 0400 01/20/18 0500 01/21/18 7846  Weight: 57.7 kg (127 lb 3.3 oz) 57.8 kg (127 lb 6.4 oz) 57.7 kg (127 lb 4.8 oz)    Examination: General: No acute respiratory distress - alert  Lungs: CTA B  Cardiovascular: RRR w/o M  Abdomen: NT/ND, soft, bs+, no mass  Extremities: no signif B LE edema   CBC: Recent Labs  Lab 01/18/18 0242 01/20/18 0331 01/21/18 0341  WBC 9.7 12.5* 13.7*  HGB 9.9* 9.9* 9.8*  HCT 31.7* 31.7* 31.3*  MCV 76.9* 78.5 77.7*  PLT 465* 441* 441*   Basic Metabolic Panel:  Recent Labs  Lab 01/16/18 0248  01/18/18 0242 01/19/18 0336 01/20/18 0331 01/21/18 0341  NA 134*   < > 141  140 140 138 137  K 4.1   < > 3.7  3.6 3.6 3.7 4.1  CL 104   < > 106  104 106 105 104  CO2 18*   < > 22  22 24 23 23   GLUCOSE 129*   < > 110*  109* 130* 146* 113*  BUN 92*   < > 95*  95* 91* 88* 85*  CREATININE 4.95*   < > 3.62*  3.85* 3.73* 3.34* 3.38*  CALCIUM 8.3*   < > 8.4*  8.5* 8.8* 8.8* 8.8*  MG  --   --  1.5* 2.3  --   --   PHOS 5.6*  --  5.3*  --   --  4.6   < > = values in this interval not displayed.   GFR: Estimated Creatinine Clearance: 10.9 mL/min (A) (by C-G formula based on SCr of 3.38 mg/dL (H)).  Liver Function  Tests: Recent Labs  Lab 01/14/18 1834 01/15/18 0232 01/16/18 0248 01/18/18 0242 01/20/18 0331 01/21/18 0341  AST 52* 49* 48*  --  14*  --   ALT 32 34 40  --  24  --   ALKPHOS 78 77 72  --  65  --   BILITOT 0.6 0.4 0.6  --  0.6  --   PROT 5.8* 6.2* 5.7*  --  5.7*  --   ALBUMIN 2.4* 2.6* 2.7* 2.6* 2.6* 2.6*    Cardiac Enzymes: Recent Labs  Lab 01/16/18 0248  CKTOTAL 442*    HbA1C: Hgb A1c MFr Bld  Date/Time Value Ref Range Status  01/14/2018 10:58 AM 5.9 (H) 4.8 - 5.6 % Final    Comment:    (NOTE) Pre diabetes:          5.7%-6.4% Diabetes:              >6.4% Glycemic control for   <7.0% adults with diabetes     Recent Results (from the past 240 hour(s))  Respiratory Panel by PCR     Status: None   Collection Time: 01/14/18  3:03 AM  Result Value Ref Range Status   Adenovirus NOT DETECTED NOT DETECTED Final   Coronavirus 229E NOT DETECTED NOT DETECTED Final   Coronavirus HKU1 NOT DETECTED NOT DETECTED Final   Coronavirus NL63 NOT DETECTED NOT DETECTED Final   Coronavirus OC43 NOT DETECTED NOT DETECTED Final   Metapneumovirus NOT DETECTED NOT DETECTED Final   Rhinovirus / Enterovirus NOT DETECTED NOT DETECTED Final   Influenza A NOT DETECTED NOT DETECTED Final   Influenza B NOT DETECTED NOT DETECTED Final   Parainfluenza Virus 1 NOT DETECTED NOT DETECTED Final   Parainfluenza Virus 2 NOT DETECTED NOT DETECTED Final   Parainfluenza Virus 3 NOT DETECTED NOT DETECTED Final   Parainfluenza Virus 4 NOT DETECTED NOT DETECTED Final   Respiratory Syncytial Virus NOT DETECTED NOT DETECTED Final   Bordetella pertussis NOT DETECTED NOT DETECTED Final   Chlamydophila pneumoniae NOT DETECTED NOT DETECTED Final   Mycoplasma pneumoniae NOT DETECTED NOT DETECTED Final    Comment: Performed at [facility], [address], [place], [place] [address]  Culture, blood (x 2)     Status: None   Collection Time: 01/14/18  6:32 AM  Result Value Ref Range Status   Specimen  Description BLOOD RIGHT ANTECUBITAL  Final   Special Requests  Final    BOTTLES DRAWN AEROBIC ONLY Blood Culture adequate volume   Culture   Final    NO GROWTH 5 DAYS Performed at [facility], [address] [place], [place] [address]    Report Status 01/19/2018 FINAL  Final  Culture, blood (x 2)     Status: None   Collection Time: 01/14/18  6:37 AM  Result Value Ref Range Status   Specimen Description BLOOD RIGHT HAND  Final   Special Requests   Final    BOTTLES DRAWN AEROBIC ONLY Blood Culture adequate volume   Culture   Final    NO GROWTH 5 DAYS Performed at [facility], [address] [place], [place] [address]    Report Status 01/19/2018 FINAL  Final     Scheduled Meds: . diltiazem  60 mg Oral Q6H  . mometasone-formoterol  2 puff Inhalation BID  . pantoprazole  40 mg Oral Q1200  . tiotropium  18 mcg Inhalation Daily      LOS: 7 days   Lonia BloodJeffrey T. McClung, MD [facility] Office  [phone] Pager - Text Page per Amion as per below:  On-Call/Text Page:      [email or web address]      password [email or web address]  If 7PM-7AM, please contact night-coverage [email or web address] Password [email or web address] 01/21/2018, 1:26 PM

## 2018-01-21 NOTE — Progress Notes (Signed)
S: No complaints, sitting in chair eating breakfast O:BP (!) 135/51 (BP Location: Right Arm)   Pulse 69   Temp 97.7 F (36.5 C) (Oral)   Resp 18   Ht 5' 4" (1.626 m)   Wt 57.7 kg (127 lb 4.8 oz)   SpO2 100%   BMI 21.85 kg/m  No intake or output data in the 24 hours ending 01/21/18 1207 Intake/Output: No intake/output data recorded.  Intake/Output this shift:  No intake/output data recorded. Weight change: -0.045 kg (-1.6 oz) Gen: NAD CVS: no rub Resp: scattered rhonchi bilaterally Abd:+BS, soft, NT/ND Ext: trace presacral edema  Recent Labs  Lab 01/14/18 1834 01/15/18 0232 01/16/18 0248 01/17/18 0324 01/18/18 0242 01/19/18 0336 01/20/18 0331 01/21/18 0341  NA  --  132* 134* 139 141  140 140 138 137  K  --  3.9 4.1 4.0 3.7  3.6 3.6 3.7 4.1  CL  --  102 104 106 106  104 106 105 104  CO2  --  17* 18* 19* _0 GLUCOSE  --  173* 129* 82 110*  109* 130* 146* 113*  BUN  --  71* 92* 107* 95*  95* 91* 88* 85*  CREATININE  --  4.98* 4.95* 4.55* 3.62*  3.85* 3.73* 3.34* 3.38*  ALBUMIN 2.4* 2.6* 2.7*  --  2.6*  --  2.6* 2.6*  CALCIUM  --  8.4* 8.3* 8.1* 8.4*  8.5* 8.8* 8.8* 8.8*  PHOS  --   --  5.6*  --  5.3*  --   --  4.6  AST 52* 49* 48*  --   --   --  14*  --   ALT 32 34 40  --   --   --  24  --    Liver Function Tests: Recent Labs  Lab 01/15/18 0232 01/16/18 0248 01/18/18 0242 01/20/18 0331 01/21/18 0341  AST 49* 48*  --  14*  --   ALT 34 40  --  24  --   ALKPHOS 77 72  --  65  --   BILITOT 0.4 0.6  --  0.6  --   PROT 6.2* 5.7*  --  5.7*  --   ALBUMIN 2.6* 2.7* 2.6* 2.6* 2.6*   No results for input(s): LIPASE, AMYLASE in the last 168 hours. No results for input(s): AMMONIA in the last 168 hours. CBC: Recent Labs  Lab 01/16/18 0248 01/17/18 0324 01/18/18 0242 01/20/18 0331 01/21/18 0341  WBC 15.3* 9.7 9.7 12.5* 13.7*  HGB 8.7* 10.0* 9.9* 9.9* 9.8*  HCT 27.5* 32.2* 31.7* 31.7* 31.3*  MCV 77.5* 78.2 76.9* 78.5 77.7*  PLT 387 459*  465* 441* 441*   Cardiac Enzymes: Recent Labs  Lab 01/16/18 0248  CKTOTAL 442*   CBG: No results for input(s): GLUCAP in the last 168 hours.  Iron Studies: No results for input(s): IRON, TIBC, TRANSFERRIN, FERRITIN in the last 72 hours. Studies/Results: No results found. . diltiazem  60 mg Oral Q6H  . mometasone-formoterol  2 puff Inhalation BID  . pantoprazole  40 mg Oral Q1200  . tiotropium  18 mcg Inhalation Daily    BMET    Component Value Date/Time   NA 137 01/21/2018 0341   K 4.1 01/21/2018 0341   CL 104 01/21/2018 0341   CO2 23 01/21/2018 0341   GLUCOSE 113 (H) 01/21/2018 0341   BUN 85 (H) 01/21/2018 0341   CREATININE 3.38 (H) 01/21/2018 0341   CALCIUM 8.8 (L)  01/21/2018 0341   GFRNONAA 12 (L) 01/21/2018 0341   GFRAA 13 (L) 01/21/2018 0341   CBC    Component Value Date/Time   WBC 13.7 (H) 01/21/2018 0341   RBC 4.03 01/21/2018 0341   HGB 9.8 (L) 01/21/2018 0341   HCT 31.3 (L) 01/21/2018 0341   PLT 441 (H) 01/21/2018 0341   MCV 77.7 (L) 01/21/2018 0341   MCH 24.3 (L) 01/21/2018 0341   MCHC 31.3 01/21/2018 0341   RDW 16.3 (H) 01/21/2018 0341   LYMPHSABS 1.2 01/14/2018 0113   MONOABS 0.9 01/14/2018 0113   EOSABS 0.1 01/14/2018 0113   BASOSABS 0.0 01/14/2018 0113     Assessment/Plan:  1. AKI- non-oliguric and a significant change from her baseline Cr of 0.93 on 12/14/17 and was 4.98 on admission 01/14/18. She did note gross hematuria 2-3 days prior to admission but reports that it has cleared up. She does have a history of nephrolithiasis, however US was without evidence of obstruction or stones. She denies any N/V/D, dysuria, pyuria, retention or urgency. No NSAIDs or recent antibiotics. 1. Normal C3 and C4, elevated CK at 1256, ESR elevated at 40, negative hepatitis panel,negativeSPEP and UPEP,ANCA,dsDNA, ASO, andANA.urine eosinophilspending for AINas she is on a PPI.  2. S/prenalbiopsy7/3/19with IR, hopefully will get preliminary on  Monday 3. Renal function continues to slowly improve. 2. A fib with RVR- now rate controlled on Dilt and metoprolol po, per primary svc 3. Acute on chronic respiratory distress- pt with history of COPD and IVF's stopped this morning due to some SOB.some pulmonary edema seen on CXR. 1. Good diuresis with one dose of lasix without significant change in Cr but with bump in BUN. 2. Continue to follow 4. Anemia- pt with history of hematochezia. Hgb was 11 in May, GI eval per primary 5. COPD- on home O2 6. Hyponatremia- due to #1 and #2, improved after IVF's.  7. Hypothyroidism- on replacement and TSH low at 0.032 per primary 8. Disposition- will likely need SNF placement due to deconditioning   [name] A. Coladonato, MD [facility] [phone] 

## 2018-01-22 LAB — RENAL FUNCTION PANEL
ALBUMIN: 2.7 g/dL — AB (ref 3.5–5.0)
Anion gap: 11 (ref 5–15)
BUN: 88 mg/dL — AB (ref 8–23)
CALCIUM: 9 mg/dL (ref 8.9–10.3)
CO2: 22 mmol/L (ref 22–32)
CREATININE: 3.63 mg/dL — AB (ref 0.44–1.00)
Chloride: 104 mmol/L (ref 98–111)
GFR calc Af Amer: 12 mL/min — ABNORMAL LOW (ref >60)
GFR, EST NON AFRICAN AMERICAN: 11 mL/min — AB (ref >60)
GLUCOSE: 136 mg/dL — AB (ref 70–99)
Phosphorus: 4.6 mg/dL (ref 2.5–4.6)
Potassium: 4.3 mmol/L (ref 3.5–5.1)
SODIUM: 137 mmol/L (ref 135–145)

## 2018-01-22 LAB — CBC
HCT: 31 % — ABNORMAL LOW (ref 36.0–46.0)
Hemoglobin: 9.7 g/dL — ABNORMAL LOW (ref 12.0–15.0)
MCH: 24.4 pg — ABNORMAL LOW (ref 26.0–34.0)
MCHC: 31.3 g/dL (ref 30.0–36.0)
MCV: 78.1 fL (ref 78.0–100.0)
PLATELETS: 425 10*3/uL — AB (ref 150–400)
RBC: 3.97 MIL/uL (ref 3.87–5.11)
RDW: 16.6 % — AB (ref 11.5–15.5)
WBC: 12.8 10*3/uL — AB (ref 4.0–10.5)

## 2018-01-22 MED ORDER — ACETAMINOPHEN 325 MG PO TABS: 650.0000 mg | ORAL_TABLET | Freq: Four times a day (QID) | ORAL | Status: AC | PRN

## 2018-01-22 MED ORDER — FERROUS GLUCONATE 324 (38 FE) MG PO TABS: 324.0000 mg | ORAL_TABLET | Freq: Two times a day (BID) | ORAL | 3 refills | Status: AC

## 2018-01-22 MED ORDER — MOMETASONE FURO-FORMOTEROL FUM 200-5 MCG/ACT IN AERO: 2.0000 | INHALATION_SPRAY | Freq: Two times a day (BID) | RESPIRATORY_TRACT | Status: AC

## 2018-01-22 MED ORDER — ZOLPIDEM TARTRATE 5 MG PO TABS: 5.0000 mg | ORAL_TABLET | Freq: Every evening | ORAL | 0 refills | Status: AC | PRN

## 2018-01-22 MED ORDER — LEVALBUTEROL HCL 0.63 MG/3ML IN NEBU: 0.6300 mg | INHALATION_SOLUTION | RESPIRATORY_TRACT | 12 refills | Status: AC | PRN

## 2018-01-22 MED ORDER — FERROUS GLUCONATE 324 (38 FE) MG PO TABS
324.0000 mg | ORAL_TABLET | Freq: Two times a day (BID) | ORAL | Status: DC
  Filled 2018-01-22: qty 1

## 2018-01-22 MED ORDER — TIOTROPIUM BROMIDE MONOHYDRATE 18 MCG IN CAPS: 18.0000 ug | ORAL_CAPSULE | Freq: Every day | RESPIRATORY_TRACT | 12 refills | Status: AC

## 2018-01-22 MED ORDER — DILTIAZEM HCL ER COATED BEADS 240 MG PO CP24: 240.0000 mg | ORAL_CAPSULE | Freq: Every day | ORAL | Status: AC

## 2018-01-22 MED ORDER — SODIUM CHLORIDE 0.9 % IV SOLN
510.0000 mg | Freq: Once | INTRAVENOUS | Status: DC
  Filled 2018-01-22: qty 17

## 2018-01-22 NOTE — Progress Notes (Signed)
Subjective: Interval History: has complaints wants to go home.  Objective: Vital signs in last 24 hours: Temp:  [98.2 F (36.8 C)-98.8 F (37.1 C)] 98.8 F (37.1 C) (07/08 0816) Pulse Rate:  [68-73] 72 (07/08 0816) Resp:  [18-23] 19 (07/08 0816) BP: (145-166)/(33-72) 166/52 (07/08 0816) SpO2:  [96 %-100 %] 98 % (07/08 0816) Weight:  [57.9 kg (127 lb 11.2 oz)] 57.9 kg (127 lb 11.2 oz) (07/08 0257) Weight change: 0.181 kg (6.4 oz)  Intake/Output from previous day: No intake/output data recorded. Intake/Output this shift: No intake/output data recorded.  General appearance: alert, cooperative and no distress Resp: diminished breath sounds bilaterally and rales RLL Cardio: irregularly irregular rhythm, S1, S2 normal and systolic murmur: systolic ejection 2/6, decrescendo at 2nd left intercostal space GI: soft, non-tender; bowel sounds normal; no masses,  no organomegaly Extremities: edema 1+  Lab Results: Recent Labs    01/21/18 0341 01/22/18 0245  WBC 13.7* 12.8*  HGB 9.8* 9.7*  HCT 31.3* 31.0*  PLT 441* 425*   BMET:  Recent Labs    01/21/18 0341 01/22/18 0245  NA 137 137  K 4.1 4.3  CL 104 104  CO2 23 22  GLUCOSE 113* 136*  BUN 85* 88*  CREATININE 3.38* 3.63*  CALCIUM 8.8* 9.0   No results for input(s): PTH in the last 72 hours. Iron Studies: No results for input(s): IRON, TIBC, TRANSFERRIN, FERRITIN in the last 72 hours.  Studies/Results: No results found.  I have reviewed the patient's current medications.  Assessment/Plan: 1 CKD?/AKI bx pending, ? AIN  Vs ATN. Suspect AIN with PPI.  Vol ok.  bp mild Y 2 Afib 3 Anemia stable, Fe low bolus 4 HTN stable to ^,  5 COPD P Bx results, willgive iv Fe, dilt, Lopressor   LOS: 8 days   Dana Pham 01/22/2018,9:31 AM

## 2018-01-22 NOTE — Progress Notes (Signed)
Patient will discharge to [facility] Anticipated discharge date: 7/8 Family notified: pt sisters at bedside Transportation by sister  CSW signing off.  Burna SisJenna H. Shivaan Tierno, LCSW Clinical Social Worker [phone]

## 2018-01-22 NOTE — Plan of Care (Signed)
Discussed plan of care with patient.  Patient is excited about being discharged within the next day or so.  [facility].  Good teach back displayed.

## 2018-01-22 NOTE — Clinical Social Work Placement (Signed)
   CLINICAL SOCIAL WORK PLACEMENT  NOTE  Date:  01/22/2018  Patient Details  Name: Cena BentonDorothy W Facchini MRN: [id number] Date of Birth: 1931-12-22  Clinical Social Work is seeking post-discharge placement for this patient at the Skilled  Nursing Facility level of care (*CSW will initial, date and re-position this form in  chart as items are completed):  Yes   Patient/family provided with [facility] Clinical Social Work Department's list of facilities offering this level of care within the geographic area requested by the patient (or if unable, by the patient's family).  Yes   Patient/family informed of their freedom to choose among providers that offer the needed level of care, that participate in Medicare, Medicaid or managed care program needed by the patient, have an available bed and are willing to accept the patient.  Yes   Patient/family informed of [facility]'s ownership interest in [facility] and [facility], as well as of the fact that they are under no obligation to receive care at these facilities.  PASRR submitted to EDS on       PASRR number received on       Existing PASRR number confirmed on 01/15/18     FL2 transmitted to all facilities in geographic area requested by pt/family on 01/15/18     FL2 transmitted to all facilities within larger geographic area on       Patient informed that his/her managed care company has contracts with or will negotiate with certain facilities, including the following:        Yes   Patient/family informed of bed offers received.  [facility]     Physician recommends and patient chooses bed at      [facility].  Patient to be transferred to facility by sister     Patient family notified on 01/22/18 of transfer.  Name of family member notified:  rose     PHYSICIAN Please sign FL2, Please sign DNR     Additional Comment:     _______________________________________________ Burna SisUris, Nimrat Woolworth H, LCSW 01/22/2018, 12:52 PM

## 2018-01-22 NOTE — Discharge Summary (Addendum)
DISCHARGE SUMMARY  Dana Pham  MR#: [id number]  DOB:Mar 05, 1932  Date of Admission: 01/14/2018 Date of Discharge: 01/22/2018  Attending Physician:Zeidy Tayag Silvestre Gunner Rebecca Motta, MD  Patient's YQM:VHQIONGPCP:Patient, No Pcp Per  Consults: Nephrology   Disposition: D/C to SNF   Follow-up Appts:  Contact information for follow-up providers    Terrial Rhodesoladonato, [name], MD. Schedule an appointment as soon as possible for a visit in 1 week(s).   Specialty:  Nephrology Contact information: [address] [place] [address] [phone]            Contact information for after-discharge care    Destination    [facility] .   Service:  Skilled Nursing Contact information: [address] [place] [id number] [phone]                  Tests Needing Follow-up: -f/u results of renal bx -f/u results of urine sent for eosinophils  -repeat TSH in 6-8 weeks  -assess heart rate/rythm and consider anticoag as discussed below   Discharge Diagnoses: Acute on chronic respiratory failure with hypoxia COPD / Chronic Hypoxic Respiratory failure on 3 L O2 at home Acute kidney injury Chronic grade 1 diastolic CHF  Newly diagnosed Parox A. fib with RVR Chronic intermittent hematuria / hematochezia - chronic anemia  Hypothyroidism  Initial presentation: 84yo F w/ a Hx COPD, chronic respiratory failure with hypoxia on 3 L O2 at home,Diastolic CHF, hypothyroidism, and GERD who presented to the ED with worsening dyspnea 1 week. On admission creatinine was 5, hemoglobin 10.4, chest x-ray without acute findings, no significant edema on exam.  Hospital Course: 6/30 admit 7/3 renal bx  7/4 converted to Afib 7/5 early AM - converted to NSR  Acute on chronic respiratory failure with hypoxia Multifactorial:   COPD, CHF, acute kidney injury - much improved with minimal O2 support necessary at time of d/c - back to her baseline and stable   COPD / Chronic Hypoxic  Respiratory failure on 3 L O2 at home Has returned to home oxygen dose and is stable at time of d/c   Acute kidney failure  baseline Cr 0.9 - etiology is unclear but greatest suspicion if for AIN with PPI - no hydro on renal US - s/p renal bx 7/3 in IR - care per Nephrology w/ outpt f/u - avoid PPI - avoid ACEi/ARB, NSAIDs, hypotension/hypovolemia, and IV contrast   Chronic grade 1 diastolic CHF  No significant volume overload on physical exam at time of d/c - weight is stable  Filed Weights   01/20/18 0500 01/21/18 [id number] 01/22/18 0257  Weight: 57.8 kg (127 lb 6.4 oz) 57.7 kg (127 lb 4.8 oz) 57.9 kg (127 lb 11.2 oz)    Newly diagnosed Parox A. fib with RVR was in and out of Afib during initial portion of this hospital stay - not a candidate for anticoag at this time due to possible bleeding at presentation as well as high fall risk - outpt f/u will be required, and anticoag can be reconsidered if renal fxn improves, her stability, improves, and she if shown to have recurrent Afib   Chronic intermittent hematuria / hematochezia - chronic anemia  Anemia panel c/w ACD as well as an Fe deficient state - ferritin NOT c/w anemia of blood loss - counseled patient on need for outpatient evaluation to include colonoscopy (has declined colonoscopy in the past) - baseline Hgb 11.4 in 11/2017 - Hgb stable at 9.7 at time of d/c w/ no evidence  of active bleeding - loaded w/ ferraheme prior to d/c   Hypothyroidism TSH very low at 0.032 - FT3 normal - FT4 essentially normal - in setting of Afib synthroid has been stopped - will need repeat TSH in 6-8 weeks    Allergies as of 01/22/2018      Reactions   Latex Rash   Redness, also      Medication List    STOP taking these medications   amLODipine 10 MG tablet Commonly known as:  NORVASC   budesonide-formoterol 160-4.5 MCG/ACT inhaler Commonly known as:  SYMBICORT Replaced by:  mometasone-formoterol 200-5 MCG/ACT Aero   furosemide 20 MG  tablet Commonly known as:  LASIX   levothyroxine 25 MCG tablet Commonly known as:  SYNTHROID, LEVOTHROID   pantoprazole 40 MG tablet Commonly known as:  PROTONIX   SPIRIVA RESPIMAT 2.5 MCG/ACT Aers Generic drug:  Tiotropium Bromide Monohydrate Replaced by:  tiotropium 18 MCG inhalation capsule   VENTOLIN HFA 108 (90 Base) MCG/ACT inhaler Generic drug:  albuterol     TAKE these medications   acetaminophen 325 MG tablet Commonly known as:  TYLENOL Take 2 tablets (650 mg total) by mouth every 6 (six) hours as needed for mild pain, fever or headache.   aspirin EC 81 MG tablet Take 81 mg by mouth daily.   diltiazem 240 MG 24 hr capsule Commonly known as:  CARDIZEM CD Take 1 capsule (240 mg total) by mouth daily. Start taking on:  01/23/2018   ferrous gluconate 324 MG tablet Commonly known as:  FERGON Take 1 tablet (324 mg total) by mouth 2 (two) times daily with a meal.   levalbuterol 0.63 MG/3ML nebulizer solution Commonly known as:  XOPENEX Take 3 mLs (0.63 mg total) by nebulization every 3 (three) hours as needed for wheezing.   mometasone-formoterol 200-5 MCG/ACT Aero Commonly known as:  DULERA Inhale 2 puffs into the lungs 2 (two) times daily. Replaces:  budesonide-formoterol 160-4.5 MCG/ACT inhaler   tiotropium 18 MCG inhalation capsule Commonly known as:  SPIRIVA Place 1 capsule (18 mcg total) into inhaler and inhale daily. Start taking on:  01/23/2018 Replaces:  SPIRIVA RESPIMAT 2.5 MCG/ACT Aers   zolpidem 5 MG tablet Commonly known as:  AMBIEN Take 1 tablet (5 mg total) by mouth at bedtime as needed for sleep.       Day of Discharge BP (!) 166/52 (BP Location: Right Arm)   Pulse 72   Temp 98.8 F (37.1 C) (Oral)   Resp 19   Ht 5\' 4"  (1.626 m)   Wt 57.9 kg (127 lb 11.2 oz)   SpO2 98%   BMI 21.92 kg/m   Physical Exam: General: No acute respiratory distress Lungs: Clear to auscultation bilaterally without wheezes or crackles Cardiovascular: Regular  rate and rhythm without murmur gallop or rub normal S1 and S2 Abdomen: Nontender, nondistended, soft, bowel sounds positive, no rebound, no ascites, no appreciable mass Extremities: No significant cyanosis, clubbing, or edema bilateral lower extremities  Basic Metabolic Panel: Recent Labs  Lab 01/16/18 0248  01/18/18 0242 01/19/18 0336 01/20/18 0331 01/21/18 0341 01/22/18 0245  NA 134*   < > 141  140 140 138 137 137  K 4.1   < > 3.7  3.6 3.6 3.7 4.1 4.3  CL 104   < > 106  104 106 105 104 104  CO2 18*   < > 22  22 24 23 23 22   GLUCOSE 129*   < > 110*  109* 130* 146* 113*  136*  BUN 92*   < > 95*  95* 91* 88* 85* 88*  CREATININE 4.95*   < > 3.62*  3.85* 3.73* 3.34* 3.38* 3.63*  CALCIUM 8.3*   < > 8.4*  8.5* 8.8* 8.8* 8.8* 9.0  MG  --   --  1.5* 2.3  --   --   --   PHOS 5.6*  --  5.3*  --   --  4.6 4.6   < > = values in this interval not displayed.    Liver Function Tests: Recent Labs  Lab 01/16/18 0248 01/18/18 0242 01/20/18 0331 01/21/18 0341 01/22/18 0245  AST 48*  --  14*  --   --   ALT 40  --  24  --   --   ALKPHOS 72  --  65  --   --   BILITOT 0.6  --  0.6  --   --   PROT 5.7*  --  5.7*  --   --   ALBUMIN 2.7* 2.6* 2.6* 2.6* 2.7*    CBC: Recent Labs  Lab 01/17/18 0324 01/18/18 0242 01/20/18 0331 01/21/18 0341 01/22/18 0245  WBC 9.7 9.7 12.5* 13.7* 12.8*  HGB 10.0* 9.9* 9.9* 9.8* 9.7*  HCT 32.2* 31.7* 31.7* 31.3* 31.0*  MCV 78.2 76.9* 78.5 77.7* 78.1  PLT 459* 465* 441* 441* 425*    Cardiac Enzymes: Recent Labs  Lab 01/16/18 0248  CKTOTAL 442*   BNP (last 3 results) Recent Labs    01/14/18 0113  BNP 551.4*    Recent Results (from the past 240 hour(s))  Respiratory Panel by PCR     Status: None   Collection Time: 01/14/18  3:03 AM  Result Value Ref Range Status   Adenovirus NOT DETECTED NOT DETECTED Final   Coronavirus 229E NOT DETECTED NOT DETECTED Final   Coronavirus HKU1 NOT DETECTED NOT DETECTED Final   Coronavirus NL63 NOT  DETECTED NOT DETECTED Final   Coronavirus OC43 NOT DETECTED NOT DETECTED Final   Metapneumovirus NOT DETECTED NOT DETECTED Final   Rhinovirus / Enterovirus NOT DETECTED NOT DETECTED Final   Influenza A NOT DETECTED NOT DETECTED Final   Influenza B NOT DETECTED NOT DETECTED Final   Parainfluenza Virus 1 NOT DETECTED NOT DETECTED Final   Parainfluenza Virus 2 NOT DETECTED NOT DETECTED Final   Parainfluenza Virus 3 NOT DETECTED NOT DETECTED Final   Parainfluenza Virus 4 NOT DETECTED NOT DETECTED Final   Respiratory Syncytial Virus NOT DETECTED NOT DETECTED Final   Bordetella pertussis NOT DETECTED NOT DETECTED Final   Chlamydophila pneumoniae NOT DETECTED NOT DETECTED Final   Mycoplasma pneumoniae NOT DETECTED NOT DETECTED Final    Comment: Performed at [facility], [address], [place], [place] [address]  Culture, blood (x 2)     Status: None   Collection Time: 01/14/18  6:32 AM  Result Value Ref Range Status   Specimen Description BLOOD RIGHT ANTECUBITAL  Final   Special Requests   Final    BOTTLES DRAWN AEROBIC ONLY Blood Culture adequate volume   Culture   Final    NO GROWTH 5 DAYS Performed at [facility], [address] [place], [place] [address]    Report Status 01/19/2018 FINAL  Final  Culture, blood (x 2)     Status: None   Collection Time: 01/14/18  6:37 AM  Result Value Ref Range Status   Specimen Description BLOOD RIGHT HAND  Final   Special Requests  Final    BOTTLES DRAWN AEROBIC ONLY Blood Culture adequate volume   Culture   Final    NO GROWTH 5 DAYS Performed at [facility], [address], [place], [place] [address]    Report Status 01/19/2018 FINAL  Final      Time spent in discharge (includes decision making & examination of pt): 35 minutes  01/22/2018, 12:09 PM   Lonia Blood, MD [facility] Office  [phone] Pager [phone]  On-Call/Text Page:      [email or web address]      password [facility]

## 2018-01-22 NOTE — Progress Notes (Signed)
Attempted to call report X2 with no answer.  

## 2018-01-22 NOTE — Discharge Instructions (Signed)

## 2018-01-30 ENCOUNTER — Encounter (HOSPITAL_COMMUNITY): Payer: Self-pay | Admitting: Nephrology

## 2018-02-09 ENCOUNTER — Encounter (HOSPITAL_COMMUNITY): Payer: Self-pay | Admitting: Nephrology

## 2018-02-09 ENCOUNTER — Telehealth: Payer: Self-pay | Admitting: Family Medicine

## 2018-02-09 NOTE — Telephone Encounter (Signed)
Message sent to Dr. Jordan for review and approval. 

## 2018-02-09 NOTE — Telephone Encounter (Signed)
Copied from CRM [phone]. Topic: Quick Communication - See Telephone Encounter >> Feb 09, 2018  3:13 PM Terisa Starraylor, Brittany L wrote: CRM for notification. See Telephone encounter for: 02/09/18.  Darlene, RN with [facility] called and needs orders to see the patient for skilled nursing. She is aware the patient has not yet established but does have a new patient appointment with Dr SwazilandJordan on 8/16. She said she wants to know could the nurse call her back about these orders. 1 week 1 2 week 1 1 week 1 2 a week after that.   Call back @ [phone] for today and can leave a voicemail. If after today, call her office @ [phone]

## 2018-02-13 NOTE — Telephone Encounter (Signed)
It is okay to give verbal authorization for skilled nursing services are requested. Please remind patient to keep appointment. Thanks, BJ.

## 2018-02-13 NOTE — Telephone Encounter (Signed)
[employer] checking status. Call back [phone]

## 2018-02-13 NOTE — Telephone Encounter (Signed)
I spoke with Nicolette and gave verbal okay for orders.

## 2018-02-15 ENCOUNTER — Ambulatory Visit: Payer: Self-pay | Admitting: *Deleted

## 2018-02-15 NOTE — Telephone Encounter (Signed)
Pt's HHRN Dana AlexandersJustin called to request a mucolytic med for patient because she has coarse breath sounds. Her b/p was 162/92 no respiratory distress. O2 sat low 90's. He was not with the patient when he called.  I called the patient and spoke to her. She states that she has an occasional nonproductive cough. She wears oxygen at 2 liters at night only. She does not have a O2 sat monitor. She denies shortness of breath, fever, wheezing or chest pain. Pt states she has had this cough for about 2 months. She has a hx of asthma, emphysema, and some COPD. She is not taking anything for her cough.  [facility]. Pt is scheduled for 03/02/18 to establish care with Dr. Swazil[place]Jordan.  Available appointments offered up for patient to come and be seen for this congestion.  Appointment scheduled per pt request.  Will route [facility]. Advised to call 911 if she has respiratory distress or any other symptoms before her appointment. Pt and sister voiced understanding.  Reason for Disposition . Cough has been present for > 3 weeks  Answer Assessment - Initial Assessment Questions error  Protocols used: COUGH - ACUTE NON-PRODUCTIVE-A-AH

## 2018-02-16 NOTE — Telephone Encounter (Signed)
Spoke with patient, gave instructions, patient verbalized understanding.

## 2018-02-16 NOTE — Telephone Encounter (Signed)
Message sent to Dr. Jordan for review and approval. 

## 2018-02-16 NOTE — Telephone Encounter (Signed)
Continue monitoring BP at home. Adequate fluid intake. If respiratory symptoms get worse or she develops fever, O2 sat less than 90, or mental status changes, among some she needs to go to ER.  Thanks, BJ

## 2018-02-23 ENCOUNTER — Encounter: Payer: Self-pay | Admitting: Family Medicine

## 2018-02-23 ENCOUNTER — Ambulatory Visit: Payer: Medicare PPO | Admitting: Family Medicine

## 2018-02-23 VITALS — BP 150/90 | HR 71 | Temp 97.9°F | Resp 16 | Ht 64.0 in | Wt 123.1 lb

## 2018-02-23 DIAGNOSIS — J449 Chronic obstructive pulmonary disease, unspecified: Secondary | ICD-10-CM | POA: Diagnosis not present

## 2018-02-23 DIAGNOSIS — I10 Essential (primary) hypertension: Secondary | ICD-10-CM | POA: Diagnosis not present

## 2018-02-23 DIAGNOSIS — R58 Hemorrhage, not elsewhere classified: Secondary | ICD-10-CM | POA: Diagnosis not present

## 2018-02-23 NOTE — Patient Instructions (Signed)
A few things to remember from today's visit:   Essential hypertension  Chronic obstructive pulmonary disease, unspecified COPD type (HCC)  Ecchymosis  Your lungs sound clear today. Monitor for fever. If you have any history of heart attack, stroke, circulation issues, or mini strokes healthy you need to continue Aspirin. Aspirin can make it easier to bruise.  Monitor your blood pressure periodically, it should be at least under 150/90.  Follow-up with your PCP if needed.

## 2018-02-23 NOTE — Progress Notes (Signed)
ACUTE VISIT   HPI:  Chief Complaint  Patient presents with  . Chest Congestion    with occasional cough off and on    [name] is a 84 y.o. female, who is here today with her sister with above concern.  According to patient, her physical therapist was concerned about her coughing and recommended arranging an appointment.  2 weeks ago she was having productive cough, clear sputum, no hemoptysis. Cough has resolved. History of COPD/asthma, currently she is on Xopenex nebulizations daily as needed, Dulera, and Spiriva.  She denies fever, chills, change in appetite, or MS changes.  She states that she does not feel bad and did not think she needed to be seen.   Recent ER visit,01/14/18,for COPD exacerbation. She was admitted and discharged to SNF on 01/22/18. She is supposed to follow with nephrologist but she is planing on following with her PCP in [facility]. Underwent renal Bx.  Dx with COPD exacerbation,diastolic dysfunction, chronic anemia with intermittent hematuria and hematochezia.  No Hx of tobacco use.  Noted elevated BP. According to patient, her blood pressure is "always high" nd today is the best reading she has had in a while, 150/90 is good for her. She denies chest pain, diaphoresis, palpitations, lower extremity edema, or headache.  She is currently on Cardizem CD 240 mg daily.  Denies headache, visual changes, chest pain, dyspnea, palpitation, focal weakness, or edema.   Noted bruising on lower extremities and upper extremities. She had easy bruising with minor trauma,mainly on forearms. Recently she "bumped" legs against furniture, reporting normal healing.  Currently she is on Aspirin 81 mg. She denies history of CVD or atrial fibrillation.   She recently moved from [facility] and she is planning on going back. She has her house there and will be living alone.  Family live here in town.   Review of Systems  Constitutional: Negative for  activity change, appetite change, fatigue and fever.  HENT: Negative for congestion, mouth sores, nosebleeds, postnasal drip, rhinorrhea and sore throat.   Eyes: Negative for redness and visual disturbance.  Respiratory: Negative for cough, shortness of breath and wheezing.   Cardiovascular: Negative for chest pain, palpitations and leg swelling.  Gastrointestinal: Negative for abdominal pain, nausea and vomiting.       Negative for changes in bowel habits.  Genitourinary: Negative for decreased urine volume and hematuria.  Musculoskeletal: Positive for gait problem. Negative for myalgias.  Neurological: Negative for syncope, weakness and headaches.  Hematological: Bruises/bleeds easily.  Psychiatric/Behavioral: Positive for sleep disturbance. Negative for confusion.      Current Outpatient Medications on File Prior to Visit  Medication Sig Dispense Refill  . acetaminophen (TYLENOL) 325 MG tablet Take 2 tablets (650 mg total) by mouth every 6 (six) hours as needed for mild pain, fever or headache.    Marland Kitchen aspirin EC 81 MG tablet Take 81 mg by mouth daily.    Marland Kitchen diltiazem (CARDIZEM CD) 240 MG 24 hr capsule Take 1 capsule (240 mg total) by mouth daily.    . ferrous gluconate (FERGON) 324 MG tablet Take 1 tablet (324 mg total) by mouth 2 (two) times daily with a meal.  3  . levalbuterol (XOPENEX) 0.63 MG/3ML nebulizer solution Take 3 mLs (0.63 mg total) by nebulization every 3 (three) hours as needed for wheezing. 3 mL 12  . mometasone-formoterol (DULERA) 200-5 MCG/ACT AERO Inhale 2 puffs into the lungs 2 (two) times daily.    Marland Kitchen tiotropium (SPIRIVA)  18 MCG inhalation capsule Place 1 capsule (18 mcg total) into inhaler and inhale daily. 30 capsule 12  . zolpidem (AMBIEN) 5 MG tablet Take 1 tablet (5 mg total) by mouth at bedtime as needed for sleep. 30 tablet 0   No current facility-administered medications on file prior to visit.      Past Medical History:  Diagnosis Date  . COPD (chronic  obstructive pulmonary disease) (HCC)   . Essential hypertension   . GERD (gastroesophageal reflux disease)   . Hypothyroidism    Allergies  Allergen Reactions  . Latex Rash    Redness, also    Social History   Socioeconomic History  . Marital status: Widowed    Spouse name: Not on file  . Number of children: Not on file  . Years of education: Not on file  . Highest education level: Not on file  Occupational History  . Not on file  Social Needs  . Financial resource strain: Not on file  . Food insecurity:    Worry: Not on file    Inability: Not on file  . Transportation needs:    Medical: Not on file    Non-medical: Not on file  Tobacco Use  . Smoking status: Never Smoker  . Smokeless tobacco: Never Used  Substance and Sexual Activity  . Alcohol use: Not Currently  . Drug use: Not Currently  . Sexual activity: Not on file  Lifestyle  . Physical activity:    Days per week: Not on file    Minutes per session: Not on file  . Stress: Not on file  Relationships  . Social connections:    Talks on phone: Not on file    Gets together: Not on file    Attends religious service: Not on file    Active member of club or organization: Not on file    Attends meetings of clubs or organizations: Not on file    Relationship status: Not on file  Other Topics Concern  . Not on file  Social History Narrative  . Not on file    Vitals:   02/23/18 0929  BP: (!) 150/90  Pulse: 71  Resp: 16  Temp: 97.9 F (36.6 C)  SpO2: 98%   Body mass index is 21.13 kg/m.   Physical Exam  Nursing note and vitals reviewed. Constitutional: She is oriented to person, place, and time. She appears well-developed. She does not appear ill. No distress.  HENT:  Head: Normocephalic and atraumatic.  Mouth/Throat: Oropharynx is clear and moist and mucous membranes are normal.  Missing teeth.  Eyes: Conjunctivae are normal.  Cardiovascular: Normal rate and regular rhythm.  No murmur  heard. Respiratory: Effort normal and breath sounds normal. No respiratory distress.  GI: Soft. There is no tenderness.  Musculoskeletal: She exhibits no edema.  Lymphadenopathy:    She has no cervical adenopathy.  Neurological: She is alert and oriented to person, place, and time. She has normal strength.  Skin: Skin is warm. Ecchymosis noted. No rash noted. No erythema.  Bilateral pretibial areas, brown crust right one.No signs of infection. Forearms with ecchymosis and a few small superficial hematomas on dorsum of hands, no tenderness.  Psychiatric: She has a normal mood and affect. Her speech is normal.  Well groomed, good eye contact.     ASSESSMENT AND PLAN:  Ms. Dana Pham was seen today for chest congestion.  Diagnoses and all orders for this visit:  Chronic obstructive pulmonary disease, unspecified COPD type (HCC)  Symptoms have resolved,today auscultation is negative for rales. No changes in current management. Instructed about warning signs. F/U with PCP in a week in [place].  Essential hypertension  Per Hx it has been poorly controlled for years. No changes in Diltiazem. Instructed to monitor BP. < than 150/90 is appropriate ,ideally < 140/90. F/U in 1-2 months with PCP   Ecchymosis  Hx of anemia related with chronic blood loss (hematochezia and hematuria).She is not sure about indication for aspirin and I do not see one on her problem list. Recommend holding on Aspirin for now.  She has an appt 03/02/18 to establish care but she doe snit want to keep it. She is planning on leaving town in a couple days, her sister corroborated  this information.  She states that [facility]    Return in about 2 months (around 04/25/2018) for HTN with PCP.     -[name] was advised to seek immediate medical attention if sudden worsening symptoms, she voices understanding.    Betty G. SwazilandJordan, MD  [facility], [facility]. [place]  office.

## 2018-03-02 ENCOUNTER — Ambulatory Visit: Payer: Medicare PPO | Admitting: Family Medicine

## 2020-02-15 IMAGING — DX DG CHEST 2V
2 series · 2 of 2 positions shown · non-contrast
Comparison: Radiographs January 14, 2018.

CLINICAL DATA: Chronic dyspnea, productive cough.

EXAM:
CHEST - 2 VIEW

[chest pa]
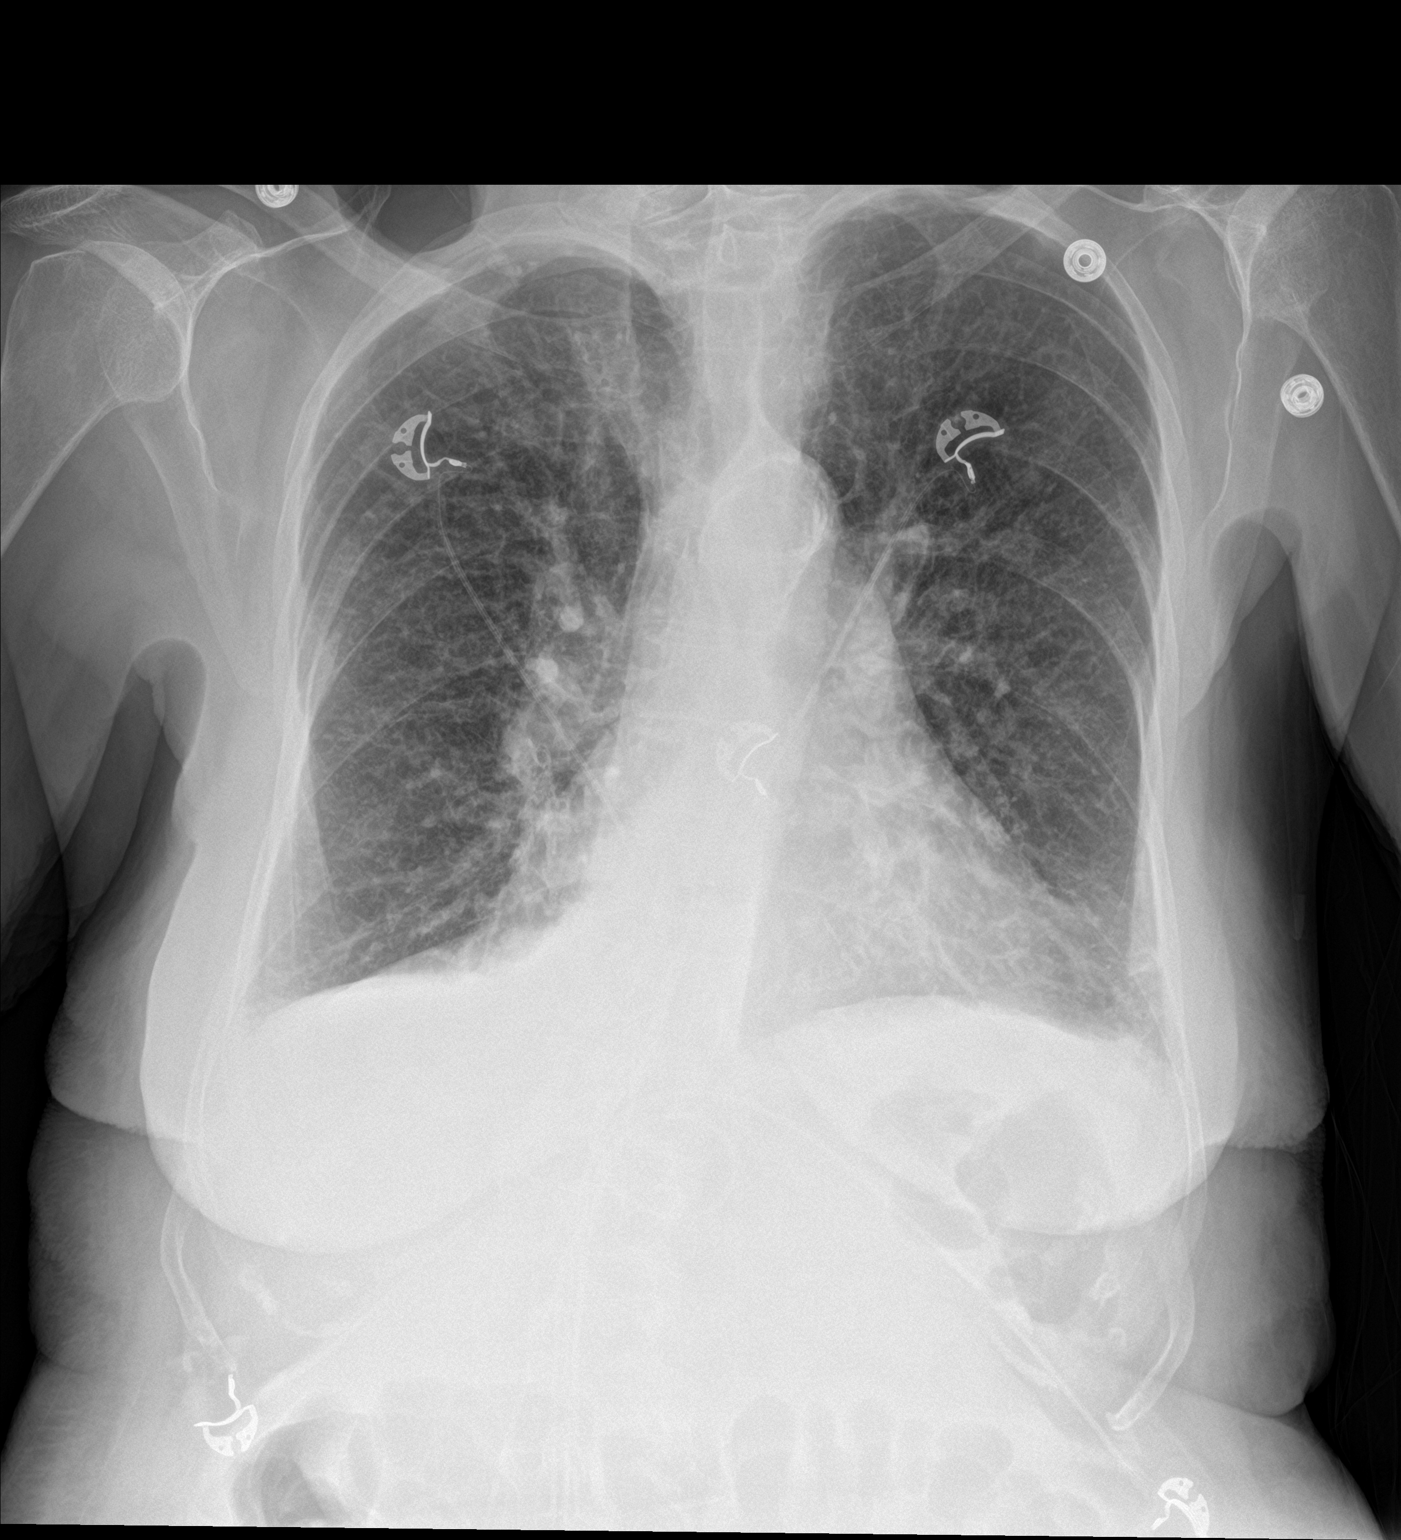

[chest lat]
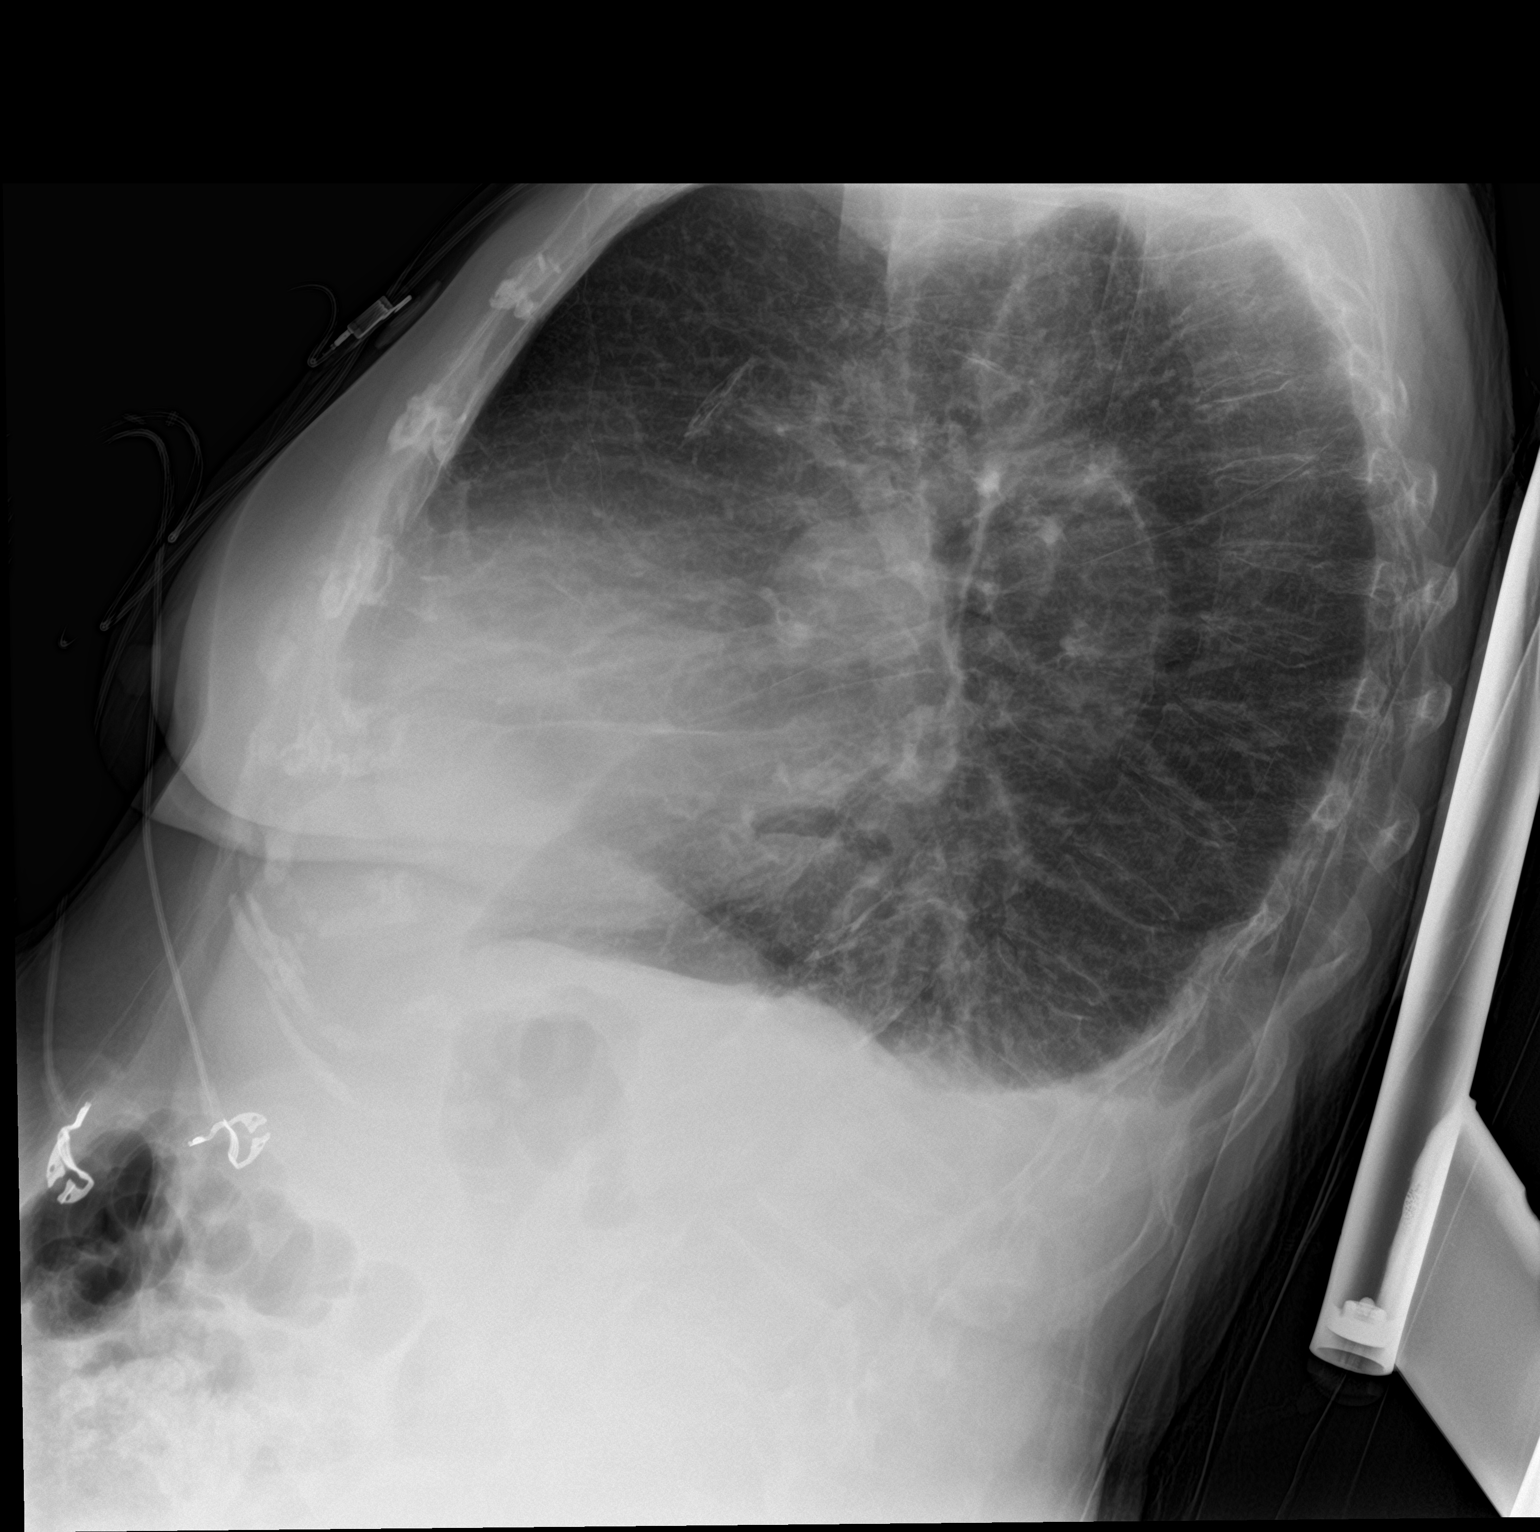

[2 of 2 positions shown; findings below may reference images not displayed]

FINDINGS: Stable cardiomediastinal silhouette. Central pulmonary vascular
congestion is noted. Atherosclerosis of thoracic aorta is noted. No
pneumothorax is noted. Minimal bilateral pleural effusions are
noted. Possible bibasilar subsegmental atelectasis or edema is
noted. Bony thorax is unremarkable.
IMPRESSION: Central pulmonary vascular congestion is noted with possible mild
bibasilar subsegmental atelectasis or edema. Minimal pleural
effusions are noted.

Aortic Atherosclerosis ([id number]).

## 2020-02-16 IMAGING — DX DG CHEST 1V PORT
1 series · 1 of 1 positions shown · non-contrast
Comparison: 01/15/2018.

CLINICAL DATA: Shortness of breath.  Cough.

EXAM:
PORTABLE CHEST 1 VIEW

[chest]
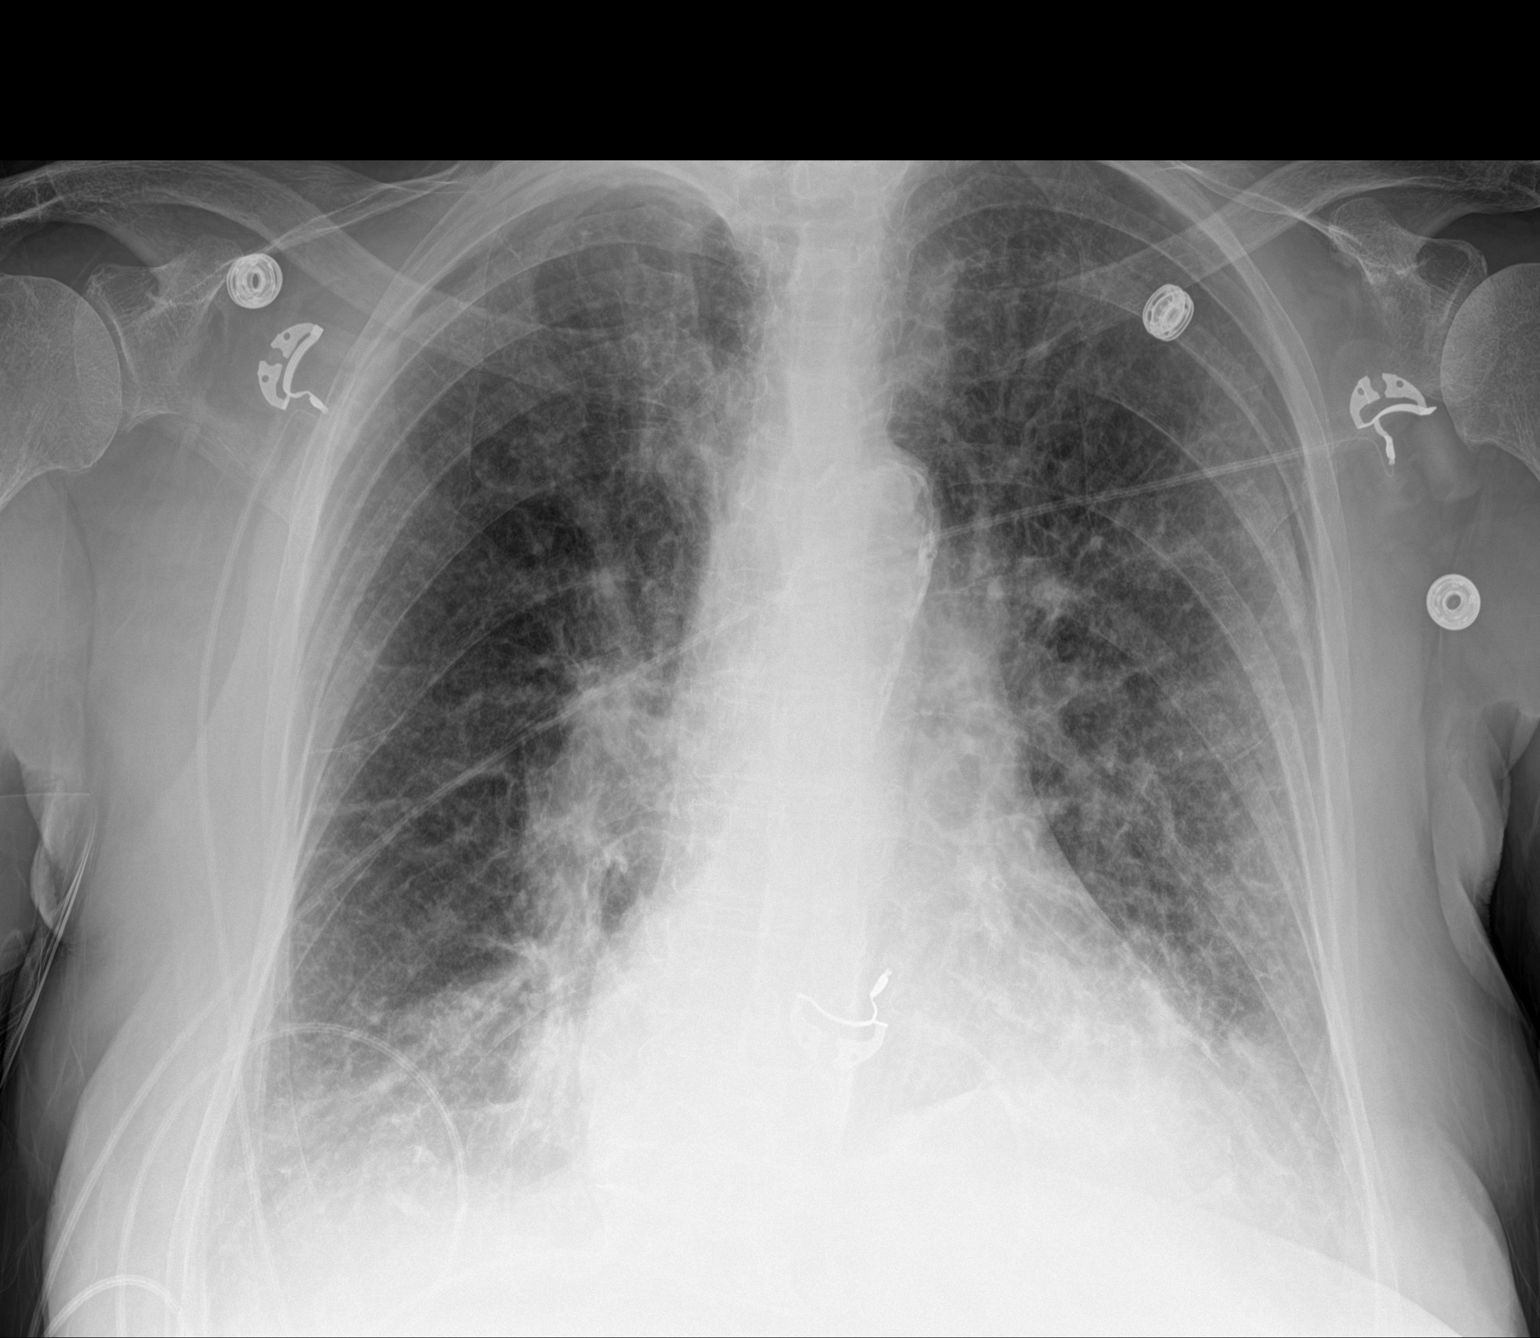

[1 of 1 positions shown; findings below may reference images not displayed]

FINDINGS: Cardiomegaly with pulmonary vascular prominence bilateral
interstitial prominence and small bilateral pleural effusions.
Findings have progressed from prior exam. No pneumothorax.
Degenerative change thoracic spine.
IMPRESSION: Findings consistent with congestive heart failure bilateral from
interstitial edema bilateral pleural effusions. Findings have
progressed from prior exam.

## 2020-02-17 IMAGING — US IR US GUIDANCE
1 series · 4 of 4 positions shown · non-contrast
Comparison: none

INDICATION: Renal failure

[Series 1: ir us guidance · 4 of 4 slices shown]
[im 1/4]
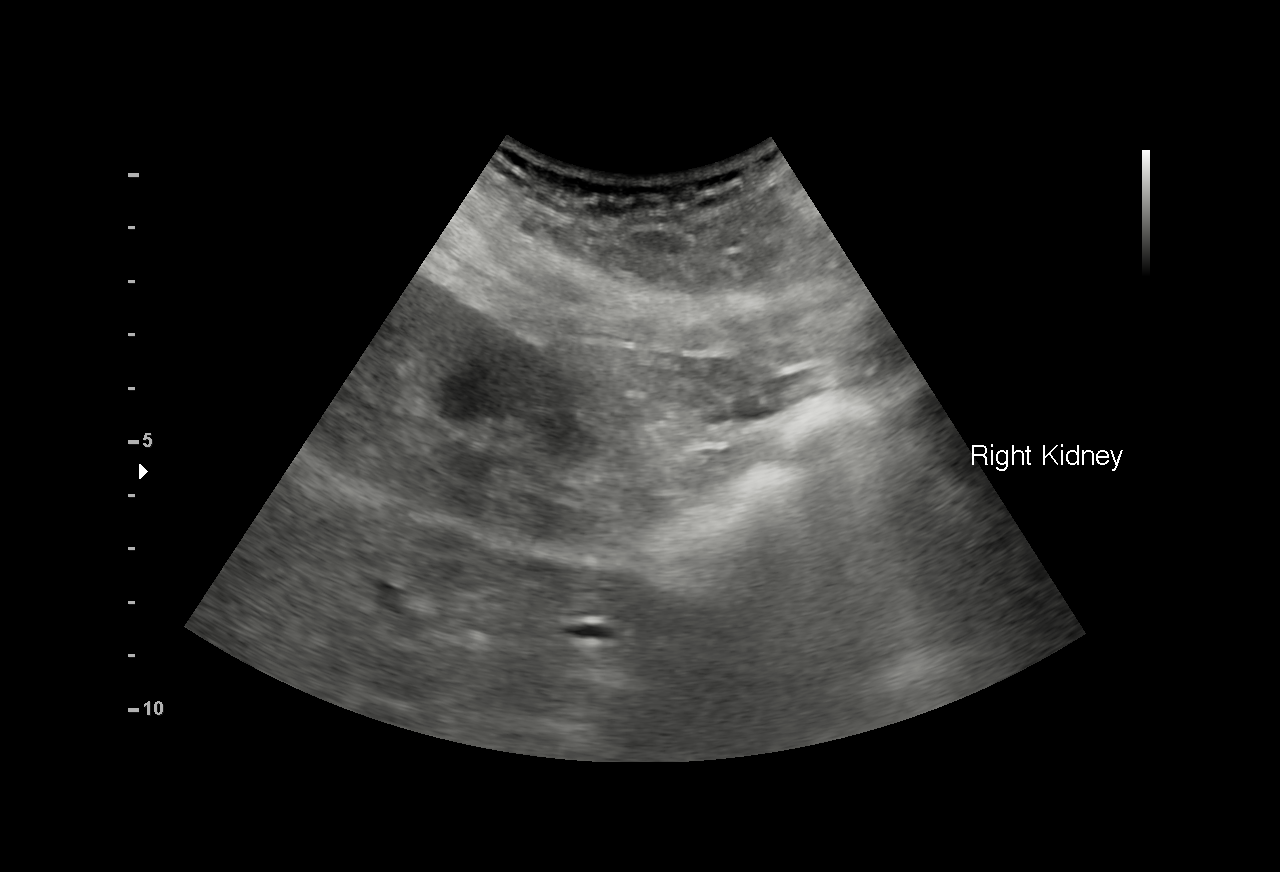
[im 2/4]
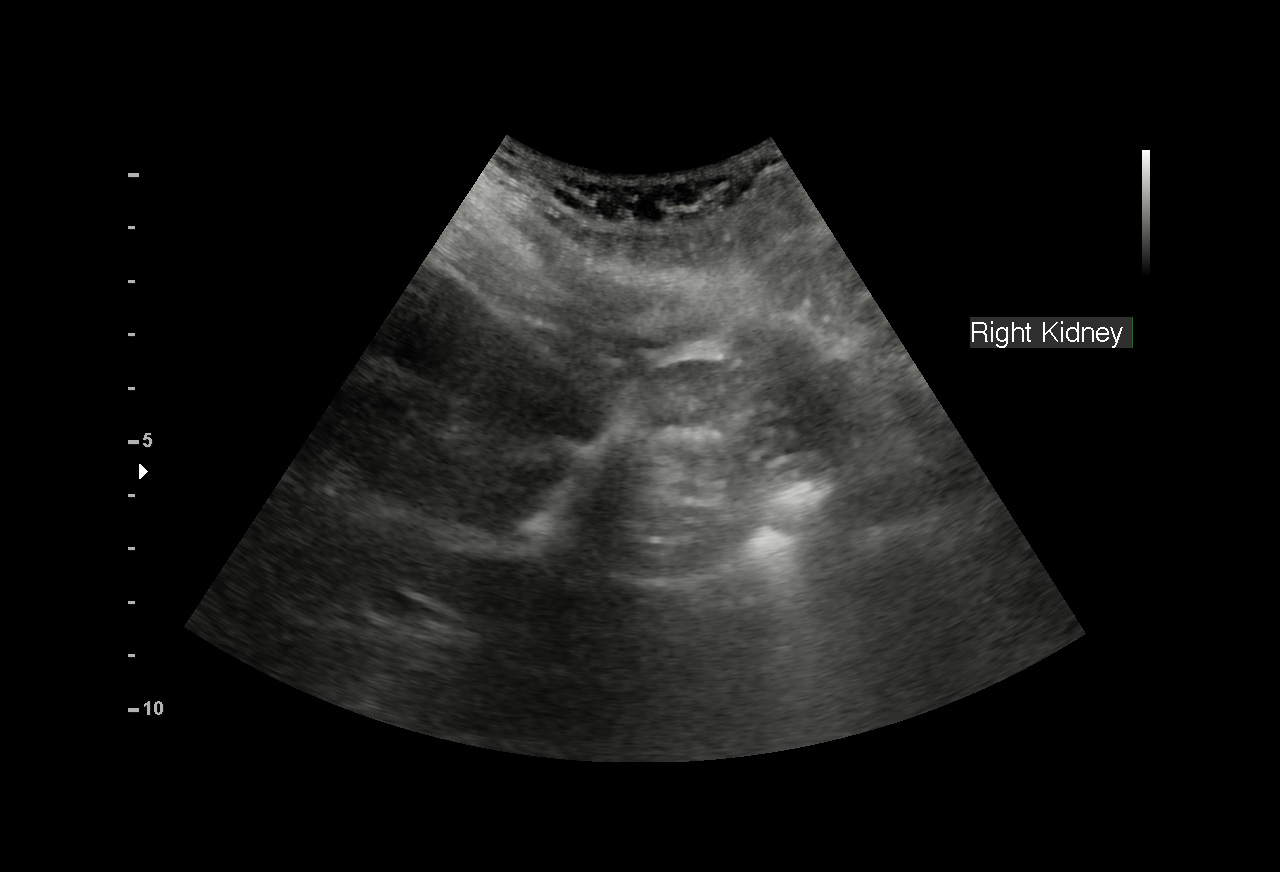
[im 3/4]
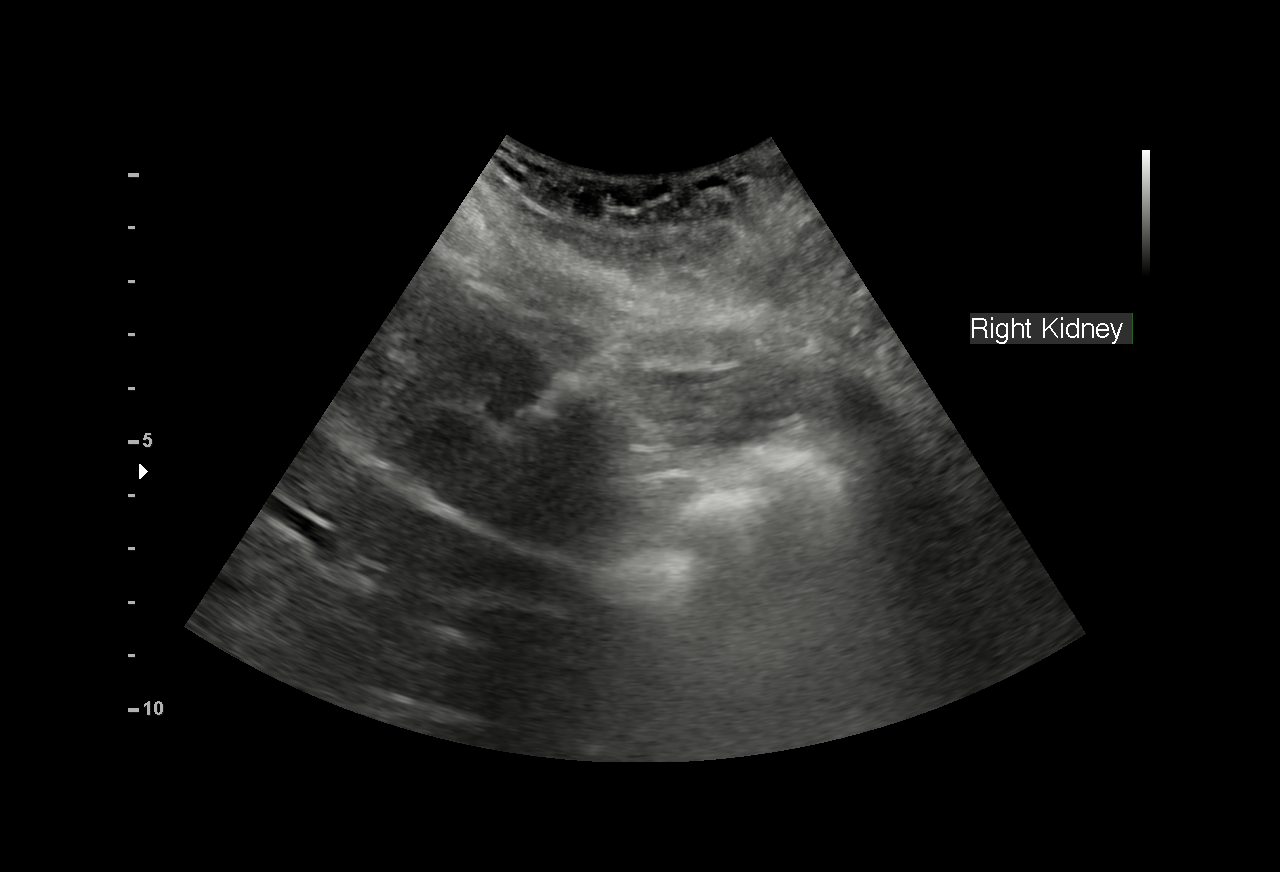
[im 4/4]
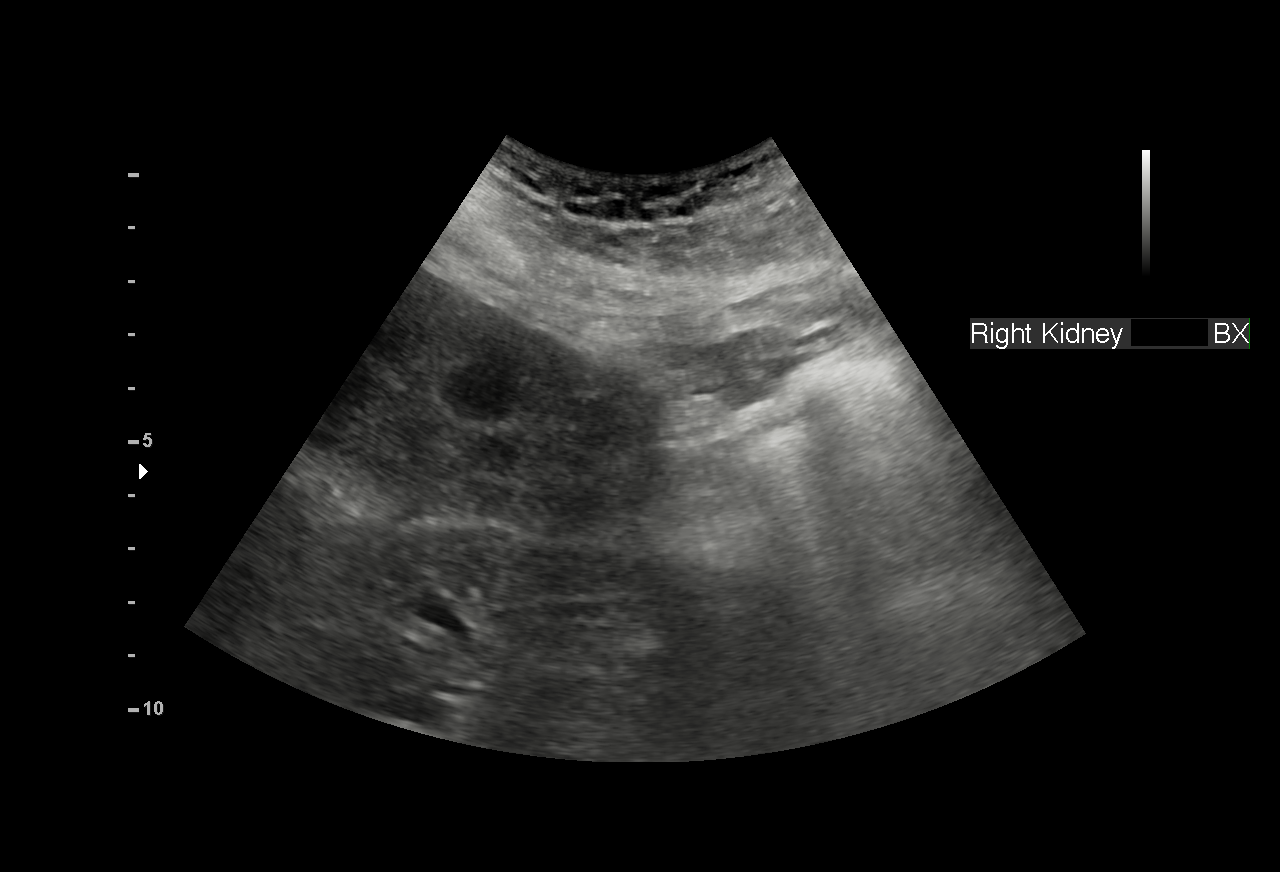

[4 of 4 positions shown; findings below may reference images not displayed]

EXAM:
ULTRASOUND-GUIDED CORE RENAL CORTEX BIOPSY

MEDICATIONS:
None.

ANESTHESIA/SEDATION:
Moderate (conscious) sedation was employed during this procedure. A
total of Versed 1 mg and Fentanyl 50 mcg was administered
intravenously.

Moderate Sedation Time: 10 minutes. The patient's level of
consciousness and vital signs were monitored continuously by
radiology nursing throughout the procedure under my direct
supervision.

FLUOROSCOPY TIME:  Fluoroscopy Time:  minutes  seconds ( mGy).

COMPLICATIONS:
None immediate.

PROCEDURE:
Informed written consent was obtained from the patient after a
thorough discussion of the procedural risks, benefits and
alternatives. All questions were addressed. Maximal Sterile Barrier
Technique was utilized including caps, mask, sterile gowns, sterile
gloves, sterile drape, hand hygiene and skin antiseptic. A timeout
was performed prior to the initiation of the procedure.

1% lidocaine was utilized for local anesthesia. Under sonographic
guidance, 2 16 gauge core biopsies of the cortex of the lower pole
of the right kidney were obtained. Samples were placed in saline.
Final imaging was performed. The patient remains stable.
FINDINGS: Images document needle placement within the cortex of the lower pole
of the right kidney. Post biopsy images demonstrate no evidence of
perinephric hematoma.
IMPRESSION: Successful ultrasound-guided random renal cortex core biopsy from
the right kidney.

## 2020-07-18 DEATH — deceased

## 2023-12-27 ENCOUNTER — Other Ambulatory Visit (HOSPITAL_COMMUNITY): Payer: Self-pay
